# Patient Record
Sex: Female | Born: 1985 | Hispanic: Yes | Marital: Married | Smoking: Never smoker
Health system: Southern US, Community
[De-identification: ages and names within clinical notes are randomized; demographics above are authoritative.]

## PROBLEM LIST (undated history)

## (undated) DIAGNOSIS — F419 Anxiety disorder, unspecified: Secondary | ICD-10-CM

## (undated) DIAGNOSIS — Z3490 Encounter for supervision of normal pregnancy, unspecified, unspecified trimester: Secondary | ICD-10-CM

## (undated) DIAGNOSIS — U071 COVID-19: Secondary | ICD-10-CM

## (undated) DIAGNOSIS — T7840XA Allergy, unspecified, initial encounter: Secondary | ICD-10-CM

## (undated) DIAGNOSIS — K429 Umbilical hernia without obstruction or gangrene: Secondary | ICD-10-CM

## (undated) DIAGNOSIS — J45909 Unspecified asthma, uncomplicated: Secondary | ICD-10-CM

## (undated) HISTORY — DX: Unspecified asthma, uncomplicated: J45.909

## (undated) HISTORY — DX: Allergy, unspecified, initial encounter: T78.40XA

## (undated) HISTORY — DX: Encounter for supervision of normal pregnancy, unspecified, unspecified trimester: Z34.90

---

## 2012-04-17 DIAGNOSIS — Z3490 Encounter for supervision of normal pregnancy, unspecified, unspecified trimester: Secondary | ICD-10-CM

## 2012-04-17 HISTORY — DX: Encounter for supervision of normal pregnancy, unspecified, unspecified trimester: Z34.90

## 2012-08-19 DIAGNOSIS — D069 Carcinoma in situ of cervix, unspecified: Secondary | ICD-10-CM | POA: Insufficient documentation

## 2013-03-20 ENCOUNTER — Ambulatory Visit (INDEPENDENT_AMBULATORY_CARE_PROVIDER_SITE_OTHER): Payer: Self-pay | Admitting: General Surgery

## 2013-03-27 ENCOUNTER — Encounter (INDEPENDENT_AMBULATORY_CARE_PROVIDER_SITE_OTHER): Payer: Self-pay

## 2013-03-27 ENCOUNTER — Encounter (INDEPENDENT_AMBULATORY_CARE_PROVIDER_SITE_OTHER): Payer: Self-pay | Admitting: General Surgery

## 2013-03-27 ENCOUNTER — Ambulatory Visit (INDEPENDENT_AMBULATORY_CARE_PROVIDER_SITE_OTHER): Payer: Self-pay | Admitting: General Surgery

## 2013-03-27 VITALS — BP 102/68 | HR 68 | Temp 97.1°F | Resp 16 | Ht 61.0 in | Wt 128.8 lb

## 2013-03-27 DIAGNOSIS — K439 Ventral hernia without obstruction or gangrene: Secondary | ICD-10-CM

## 2013-03-27 NOTE — Progress Notes (Signed)
Patient ID: Rita Lynch, female   DOB: [birth year], 27 y.o.   MRN: [id number]  No chief complaint on file.   HPI Rita Lynch is a 27 y.o. female.  The patient is a 27 year old female who is 6 months pregnant who is here for the evaluation of a ventral hernia. Patient is referred by Dr. Gildardo Griffes. This states she's had this there for approximately 4-5 years. She states that the hernias bigger with her pregnancy. She is mainly concerned that the hernia would burst at the time of delivery the baby.   The patient said no signs or symptoms of incarceration or strangulation. HPI  Past Medical History  Diagnosis Date  . Pregnant 2014    No past surgical history on file.  No family history on file.  Social History History  Substance Use Topics  . Smoking status: Never Smoker   . Smokeless tobacco: Not on file  . Alcohol Use: No    No Known Allergies  Current Outpatient Prescriptions  Medication Sig Dispense Refill  . Prenatal Vit-Fe Fumarate-FA (PRENATAL MULTIVITAMIN) TABS tablet Take 1 tablet by mouth daily at 12 noon.       No current facility-administered medications for this visit.    Review of Systems Review of Systems  Constitutional: Negative.   HENT: Negative.   Eyes: Negative.   Respiratory: Negative.   Cardiovascular: Negative.   Gastrointestinal: Negative.   Neurological: Negative.   All other systems reviewed and are negative.    Blood pressure 102/68, pulse 68, temperature 97.1 F (36.2 C), temperature source Temporal, resp. rate 16, height 5\' 1"  (1.549 m), weight 128 lb 12.8 oz (58.423 kg).  Physical Exam Physical Exam  Constitutional: She is oriented to person, place, and time. She appears well-developed and well-nourished.  HENT:  Head: Normocephalic and atraumatic.  Eyes: Conjunctivae and EOM are normal. Pupils are equal, round, and reactive to light.  Neck: Normal range of motion. Neck supple.  Cardiovascular: Normal rate, regular  rhythm and normal heart sounds.   Pulmonary/Chest: Effort normal and breath sounds normal.  Abdominal: A hernia is present. Hernia confirmed positive in the ventral area.    Musculoskeletal: Normal range of motion.  Neurological: She is alert and oriented to person, place, and time.  Skin: Skin is warm and dry.  Psychiatric: She has a normal mood and affect.    Data Reviewed none  Assessment    27 year old pregnant female, 6 months pregnant, with a primary ventral hernia.     Plan    1. I discussed with her the likelihood that the hernia would like recurrent time of her pregnancy. Also think that repair the hernia at this time would result in a very high recurrence and the pregnancy. 2. I discussed with her that there would likely coursing of the hernia upon delivery. 3. The patient would like to have this repaired after delivery her pregnancy. She will followup at that time. 4. We discussed the signs and symptoms of incarceration and strangulation and the need to present to the ER should this occur.        Rita Lynch., Rita Lynch 03/27/2013, 8:57 AM

## 2013-07-01 ENCOUNTER — Inpatient Hospital Stay (HOSPITAL_COMMUNITY)
Admission: AD | Admit: 2013-07-01 | Discharge: 2013-07-02 | DRG: 775 | Disposition: A | Discharge status: Home / Self Care | Payer: Medicaid Other | Source: Ambulatory Visit | Attending: Family Medicine | Admitting: Family Medicine

## 2013-07-01 ENCOUNTER — Encounter (HOSPITAL_COMMUNITY): Payer: Self-pay | Admitting: *Deleted

## 2013-07-01 DIAGNOSIS — IMO0001 Reserved for inherently not codable concepts without codable children: Secondary | ICD-10-CM

## 2013-07-01 LAB — CBC
HCT: 31.2 % — ABNORMAL LOW (ref 36.0–46.0)
Hemoglobin: 10.5 g/dL — ABNORMAL LOW (ref 12.0–15.0)
MCH: 27.8 pg (ref 26.0–34.0)
MCHC: 33.7 g/dL (ref 30.0–36.0)
MCV: 82.5 fL (ref 78.0–100.0)
Platelets: 176 10*3/uL (ref 150–400)
RBC: 3.78 MIL/uL — ABNORMAL LOW (ref 3.87–5.11)
RDW: 13.1 % (ref 11.5–15.5)
WBC: 17.6 10*3/uL — ABNORMAL HIGH (ref 4.0–10.5)

## 2013-07-01 MED ORDER — PRENATAL MULTIVITAMIN CH
1.0000 | ORAL_TABLET | Freq: Every day | ORAL | Status: DC
  Administered 2013-07-02: 1 via ORAL
  Filled 2013-07-01: qty 1

## 2013-07-01 MED ORDER — TETANUS-DIPHTH-ACELL PERTUSSIS 5-2.5-18.5 LF-MCG/0.5 IM SUSP: 0.5000 mL | Freq: Once | INTRAMUSCULAR | Status: DC

## 2013-07-01 MED ORDER — BENZOCAINE-MENTHOL 20-0.5 % EX AERO: 1.0000 "application " | INHALATION_SPRAY | CUTANEOUS | Status: DC | PRN

## 2013-07-01 MED ORDER — DIPHENHYDRAMINE HCL 25 MG PO CAPS: 25.0000 mg | ORAL_CAPSULE | Freq: Four times a day (QID) | ORAL | Status: DC | PRN

## 2013-07-01 MED ORDER — LANOLIN HYDROUS EX OINT: TOPICAL_OINTMENT | CUTANEOUS | Status: DC | PRN

## 2013-07-01 MED ORDER — SENNOSIDES-DOCUSATE SODIUM 8.6-50 MG PO TABS
2.0000 | ORAL_TABLET | ORAL | Status: DC
  Administered 2013-07-02: 2 via ORAL
  Filled 2013-07-01 (×2): qty 2

## 2013-07-01 MED ORDER — ZOLPIDEM TARTRATE 5 MG PO TABS: 5.0000 mg | ORAL_TABLET | Freq: Every evening | ORAL | Status: DC | PRN

## 2013-07-01 MED ORDER — ONDANSETRON HCL 4 MG PO TABS: 4.0000 mg | ORAL_TABLET | ORAL | Status: DC | PRN

## 2013-07-01 MED ORDER — ONDANSETRON HCL 4 MG/2ML IJ SOLN: 4.0000 mg | INTRAMUSCULAR | Status: DC | PRN

## 2013-07-01 MED ORDER — OXYCODONE-ACETAMINOPHEN 5-325 MG PO TABS
1.0000 | ORAL_TABLET | ORAL | Status: DC | PRN
  Administered 2013-07-02: 1 via ORAL
  Filled 2013-07-01: qty 1

## 2013-07-01 MED ORDER — DIBUCAINE 1 % RE OINT: 1.0000 "application " | TOPICAL_OINTMENT | RECTAL | Status: DC | PRN

## 2013-07-01 MED ORDER — WITCH HAZEL-GLYCERIN EX PADS: 1.0000 "application " | MEDICATED_PAD | CUTANEOUS | Status: DC | PRN

## 2013-07-01 MED ORDER — IBUPROFEN 600 MG PO TABS
600.0000 mg | ORAL_TABLET | Freq: Four times a day (QID) | ORAL | Status: DC
  Administered 2013-07-01 – 2013-07-02 (×4): 600 mg via ORAL
  Filled 2013-07-01 (×4): qty 1

## 2013-07-01 MED ORDER — SIMETHICONE 80 MG PO CHEW: 80.0000 mg | CHEWABLE_TABLET | ORAL | Status: DC | PRN

## 2013-07-01 NOTE — H&P (Addendum)
Rita Lynch is a 28 y.o. female [phone] with IUP at 8547w6d presenting for active labor. Upon arrival in the MAU she was complete and pushing. She delivered a health female w/o complications. She denies any complaints post delivery, or any complications with this or her previous two pregnancies/deliveries.   Prenatal History/Complications: None  Past Medical History: Past Medical History  Diagnosis Date  . Pregnant 2014    Past Surgical History: History reviewed. No pertinent past surgical history.  Obstetrical History: OB History   Grav Para Term Preterm Abortions TAB SAB Ect Mult Living   3 3 3       3       Social History: History   Social History  . Marital Status: Single    Spouse Name: N/A    Number of Children: N/A  . Years of Education: N/A   Social History Main Topics  . Smoking status: Never Smoker   . Smokeless tobacco: None  . Alcohol Use: No  . Drug Use: None  . Sexual Activity: Not Currently   Other Topics Concern  . None   Social History Narrative  . None    Family History: History reviewed. No pertinent family history.  Allergies: No Known Allergies  Prescriptions prior to admission  Medication Sig Dispense Refill  . Prenatal Vit-Fe Fumarate-FA (PRENATAL MULTIVITAMIN) TABS tablet Take 1 tablet by mouth daily at 12 noon.        Review of Systems: Negative unless otherwise stated in History above  Physicial Blood pressure 97/46, pulse 84, temperature 97.9 F (36.6 C), temperature source Oral, resp. rate 18, SpO2 99.00%, unknown if currently breastfeeding. General appearance: alert, cooperative and no distress Lungs: clear to auscultation bilaterally Heart: regular rate and rhythm Abdomen: soft, non-tender; bowel sounds normal; Uterus soft but not boggy Extremities: Homans sign is negative, no sign of DVT   Prenatal labs: ABO, Rh:   Antibody:   Rubella:   RPR:    HBsAg:    HIV:    GBS:    GTT: wnl  Prenatal Transfer Tool   Maternal Diabetes: No Genetic Screening: Normal Maternal Ultrasounds/Referrals: Declined Fetal Ultrasounds or other Referrals:  None Maternal Substance Abuse:  No Significant Maternal Medications:  None Significant Maternal Lab Results: Lab values include: Group B Strep negative  No results found for this or any previous visit (from the past 24 hour(s)).  Assessment: Rita Lynch is a 28 y.o. G3P3003 who delivered precipitously in MAU at 6347w6d. Doing well w/o complaints   Delivery of health baby girl - Admit to postpartum - Pain control: Ibuprofen, Ice packs  #ID:  GBS negative #Feeding: Breast/Bottle #MOC:Desires Mirena at 6wk postpartum visit (Health dept) #Circ:  NA - female  Wenda LowJames Joyner MD [facility] PGY-1 07/01/2013, 7:26 PM  I examined pt and agree with documentation above and resident plan of care. [facility], IncMUHAMMAD,WALIDAH

## 2013-07-02 ENCOUNTER — Encounter (HOSPITAL_COMMUNITY): Payer: Self-pay | Admitting: *Deleted

## 2013-07-02 MED ORDER — IBUPROFEN 600 MG PO TABS: 600.0000 mg | ORAL_TABLET | Freq: Four times a day (QID) | ORAL | Status: DC

## 2013-07-02 NOTE — Lactation Note (Addendum)
This note was copied from the chart of [name]. Lactation Consultation Note Initial consult:  Baby girl 2719 hours old. Mother states she has no milk on left breast in side lying position. Reviewed hand expression and mother has good drops of colostrum. Mother states baby recently breastfed for 15 min.  Denies soreness.  Reviewed basics, supply and demand, lactation support services and brochure. Encouraged mother to call for further assitance.  Patient Name: [name] ZOXWR'UToday's Date: 07/02/2013 Reason for consult: Initial assessment   Maternal Data Has patient been taught Hand Expression?: Yes Does the patient have breastfeeding experience prior to this delivery?: Yes  Feeding Feeding Type: Breast Fed Length of feed: 15 min  LATCH Score/Interventions                      Lactation Tools Discussed/Used     Consult Status Consult Status: PRN    Hardie PulleyBerkelhammer, Karelyn Brisby Boschen 07/02/2013, 2:33 PM

## 2013-07-02 NOTE — Discharge Instructions (Signed)

## 2013-07-02 NOTE — Progress Notes (Signed)
Ur chart review completed.  

## 2013-07-02 NOTE — Discharge Summary (Signed)
Obstetric Discharge Summary Reason for Admission: onset of labor Prenatal Procedures: Unknown Intrapartum Procedures: spontaneous vaginal delivery Postpartum Procedures: none Complications-Operative and Postpartum: none Hemoglobin  Date Value Ref Range Status  07/01/2013 10.5* 12.0 - 15.0 g/dL Final     HCT  Date Value Ref Range Status  07/01/2013 31.2* 36.0 - 46.0 % Final    Physical Exam:  General: alert, cooperative and no distress Lochia: appropriate Uterine Fundus: firm Incision: N/A DVT Evaluation: No evidence of DVT seen on physical exam. Negative Homan's sign. No cords or calf tenderness.  Discharge Diagnoses: Term Pregnancy-delivered  Discharge Information: Date: 07/02/2013 Activity: pelvic rest Diet: routine Medications: Ibuprofen Condition: stable Instructions: refer to practice specific booklet Discharge to: home Follow-up Information   [facility]   Contact information:   [address] [place] [address] [phone]     Brief Hospital Course: [name] delivered viable female precipitously in the MAU, no complications noted. Feeding with breast and bottle, desires mirena, and will follow up with health department in 6 weeks.   Newborn Data: Live born female  Birth Weight: 8 lb 3 oz (3714 g) APGAR: 8, 9  Home with mother.  Rita Lynch, Rita Lynch 07/02/2013, 8:08 AM  I spoke with and examined patient and agree with resident's note and plan of care.  Tawana ScaleMichael Ryan Jayveon Convey, MD OB Fellow 07/02/2013 12:55 PM

## 2013-07-02 NOTE — Progress Notes (Signed)
Note as dictated by Shela CommonsJ. Spurlock-Frizzell, RN: At [phone], assisted patient to room 8 via W/C, 3rd baby, 2 prior uncomplicated pregnancies. Patient was breathing with contractions. No bleeding, had started leaking at 1800, clear fluid noted in W/C when patient got up. Assisted patient into gown. Patient was calmly standing, bent over bed and quietly stated, "it's coming." Patient's husband helped her into bed. Removed pad and noted crowning. Called for a delivery table and a doctor. Blanche EastJ. Rasch, NP came into room and assisted with delivery of viable baby girl at [id number]. Baby was dried off, fluid wiped from face and mouth. Baby was placed on mother's chest and covered with a warm blanket. At 1801 Roney MarionW. Muhammad, CNM arrived.

## 2013-07-03 NOTE — H&P (Signed)
I examined pt and agree with documentation above and resident plan of care. MUHAMMAD,Lillianah Swartzentruber  

## 2014-02-16 ENCOUNTER — Encounter (HOSPITAL_COMMUNITY): Payer: Self-pay | Admitting: *Deleted

## 2015-09-18 ENCOUNTER — Encounter (HOSPITAL_BASED_OUTPATIENT_CLINIC_OR_DEPARTMENT_OTHER): Payer: Self-pay | Admitting: *Deleted

## 2015-09-18 ENCOUNTER — Emergency Department (HOSPITAL_BASED_OUTPATIENT_CLINIC_OR_DEPARTMENT_OTHER)
Admission: EM | Admit: 2015-09-18 | Discharge: 2015-09-18 | Disposition: A | Discharge status: Home / Self Care | Payer: Medicaid Other | Attending: Emergency Medicine | Admitting: Emergency Medicine

## 2015-09-18 DIAGNOSIS — J020 Streptococcal pharyngitis: Secondary | ICD-10-CM | POA: Insufficient documentation

## 2015-09-18 DIAGNOSIS — Z79899 Other long term (current) drug therapy: Secondary | ICD-10-CM | POA: Insufficient documentation

## 2015-09-18 HISTORY — DX: Umbilical hernia without obstruction or gangrene: K42.9

## 2015-09-18 LAB — RAPID STREP SCREEN (MED CTR MEBANE ONLY): STREPTOCOCCUS, GROUP A SCREEN (DIRECT): POSITIVE — AB

## 2015-09-18 MED ORDER — IBUPROFEN 800 MG PO TABS
800.0000 mg | ORAL_TABLET | Freq: Once | ORAL | Status: AC
  Administered 2015-09-18: 800 mg via ORAL
  Filled 2015-09-18: qty 1

## 2015-09-18 MED ORDER — IBUPROFEN 800 MG PO TABS: 800.0000 mg | ORAL_TABLET | Freq: Three times a day (TID) | ORAL | Status: DC

## 2015-09-18 MED ORDER — PENICILLIN G BENZATHINE 1200000 UNIT/2ML IM SUSP
1.2000 10*6.[IU] | Freq: Once | INTRAMUSCULAR | Status: AC
  Administered 2015-09-18: 1.2 10*6.[IU] via INTRAMUSCULAR
  Filled 2015-09-18: qty 2

## 2015-09-18 NOTE — Discharge Instructions (Signed)

## 2015-09-18 NOTE — ED Provider Notes (Signed)
CSN: [id number]     Arrival date & time 09/18/15  [id number] History   First MD Initiated Contact with Patient 09/18/15 [phone]     Chief Complaint  Patient presents with  . Sore Throat     (Consider location/radiation/quality/duration/timing/severity/associated sxs/prior Treatment) HPI Patient is a sore throat for approximately 2 days. She does report fever. She measured temperature yesterday and it was over 100. Pain with swallowing. No difficulty breathing. Mild diffuse headache. No cough, no nasal congestion, no earache. Mild diffuse body ache. She reports her daughter has a sore throat and an ear infection as well. She does not have known strep throat exposure.  The patient has IUD for birth control. Past Medical History  Diagnosis Date  . Pregnant 2014  . Umbilical hernia    History reviewed. No pertinent past surgical history. No family history on file. Social History  Substance Use Topics  . Smoking status: Never Smoker   . Smokeless tobacco: None  . Alcohol Use: No   OB History    Gravida Para Term Preterm AB TAB SAB Ectopic Multiple Living   4 4 4       4      Review of Systems  10 Systems reviewed and are negative for acute change except as noted in the HPI.   Allergies  Review of patient's allergies indicates no known allergies.  Home Medications   Prior to Admission medications   Medication Sig Start Date End Date Taking? Authorizing Provider  acetaminophen (TYLENOL) 325 MG tablet Take 650 mg by mouth every 6 (six) hours as needed.   Yes Historical Provider, MD  ibuprofen (ADVIL,MOTRIN) 600 MG tablet Take 1 tablet (600 mg total) by mouth every 6 (six) hours. 07/02/13   Sandrea MatteJonathan Hall, MD  ibuprofen (ADVIL,MOTRIN) 800 MG tablet Take 1 tablet (800 mg total) by mouth 3 (three) times daily. 09/18/15   Arby BarretteMarcy Jamey Harman, MD  Prenatal Vit-Fe Fumarate-FA (PRENATAL MULTIVITAMIN) TABS tablet Take 1 tablet by mouth daily at 12 noon.    Historical Provider, MD   BP 112/66 mmHg   Pulse 84  Temp(Src) 98.6 F (37 C) (Oral)  Resp 20  Ht 5\' 1"  (1.549 m)  Wt 145 lb (65.772 kg)  BMI 27.41 kg/m2  SpO2 100% Physical Exam  Constitutional: She is oriented to person, place, and time. She appears well-developed and well-nourished.  HENT:  Head: Normocephalic and atraumatic.  Posterior oropharynx, erythema bilateral tonsils. Mild exudate bilaterally. Posterior pharynx widely patent. Neck supple without meningismus. No trismus. No significant lymphadenopathy.  Eyes: EOM are normal. Pupils are equal, round, and reactive to light.  Neck: Neck supple.  Cardiovascular: Normal rate, regular rhythm, normal heart sounds and intact distal pulses.   Pulmonary/Chest: Effort normal and breath sounds normal.  Abdominal: Soft. Bowel sounds are normal. She exhibits no distension. There is no tenderness.  Musculoskeletal: Normal range of motion. She exhibits no edema.  Neurological: She is alert and oriented to person, place, and time. She has normal strength. Coordination normal. GCS eye subscore is 4. GCS verbal subscore is 5. GCS motor subscore is 6.  Skin: Skin is warm, dry and intact.  Psychiatric: She has a normal mood and affect.    ED Course  Procedures (including critical care time) Labs Review Labs Reviewed  RAPID STREP SCREEN (NOT AT [facility]) - Abnormal; Notable for the following:    Streptococcus, Group A Screen (Direct) POSITIVE (*)    All other components within normal limits    Imaging Review No  results found. I have personally reviewed and evaluated these images and lab results as part of my medical decision-making.   EKG Interpretation None      MDM   Final diagnoses:  Strep pharyngitis   Patient is nontoxic and alert. There is no airway compromise. Patient will be treated with Bicillin IM in the emergency department. She is given prescription for ibuprofen for pain and fever.    Arby Barrette, MD 09/18/15 [phone]

## 2015-09-18 NOTE — ED Notes (Addendum)
Patient states she developed a fever yesterday, and a sore throat last night.  Has been taking tylenol and advil for her fever.  Last dose 0625 am today. C/o generalized body aches and headache as well.

## 2018-04-05 ENCOUNTER — Emergency Department (HOSPITAL_BASED_OUTPATIENT_CLINIC_OR_DEPARTMENT_OTHER): Payer: Self-pay

## 2018-04-05 ENCOUNTER — Other Ambulatory Visit: Payer: Self-pay

## 2018-04-05 ENCOUNTER — Emergency Department (HOSPITAL_BASED_OUTPATIENT_CLINIC_OR_DEPARTMENT_OTHER)
Admission: EM | Admit: 2018-04-05 | Discharge: 2018-04-05 | Disposition: A | Discharge status: Home / Self Care | Payer: Self-pay | Attending: Emergency Medicine | Admitting: Emergency Medicine

## 2018-04-05 ENCOUNTER — Encounter (HOSPITAL_BASED_OUTPATIENT_CLINIC_OR_DEPARTMENT_OTHER): Payer: Self-pay

## 2018-04-05 DIAGNOSIS — R0602 Shortness of breath: Secondary | ICD-10-CM | POA: Insufficient documentation

## 2018-04-05 DIAGNOSIS — Z79899 Other long term (current) drug therapy: Secondary | ICD-10-CM | POA: Insufficient documentation

## 2018-04-05 LAB — CBC WITH DIFFERENTIAL/PLATELET
Abs Immature Granulocytes: 0.02 10*3/uL (ref 0.00–0.07)
Basophils Absolute: 0 10*3/uL (ref 0.0–0.1)
Basophils Relative: 0 %
Eosinophils Absolute: 0.1 10*3/uL (ref 0.0–0.5)
Eosinophils Relative: 1 %
HCT: 43.1 % (ref 36.0–46.0)
Hemoglobin: 13.7 g/dL (ref 12.0–15.0)
Immature Granulocytes: 0 %
LYMPHS PCT: 17 %
Lymphs Abs: 1.5 10*3/uL (ref 0.7–4.0)
MCH: 27.7 pg (ref 26.0–34.0)
MCHC: 31.8 g/dL (ref 30.0–36.0)
MCV: 87.2 fL (ref 80.0–100.0)
Monocytes Absolute: 0.7 10*3/uL (ref 0.1–1.0)
Monocytes Relative: 7 %
Neutro Abs: 6.9 10*3/uL (ref 1.7–7.7)
Neutrophils Relative %: 75 %
Platelets: 217 10*3/uL (ref 150–400)
RBC: 4.94 MIL/uL (ref 3.87–5.11)
RDW: 13.2 % (ref 11.5–15.5)
WBC: 9.3 10*3/uL (ref 4.0–10.5)
nRBC: 0 % (ref 0.0–0.2)

## 2018-04-05 LAB — URINALYSIS, ROUTINE W REFLEX MICROSCOPIC
BILIRUBIN URINE: NEGATIVE
Glucose, UA: NEGATIVE mg/dL
Hgb urine dipstick: NEGATIVE
Ketones, ur: NEGATIVE mg/dL
Leukocytes, UA: NEGATIVE
NITRITE: NEGATIVE
Protein, ur: NEGATIVE mg/dL
Specific Gravity, Urine: <1.005 — ABNORMAL LOW (ref 1.005–1.030)
pH: 6 (ref 5.0–8.0)

## 2018-04-05 LAB — BASIC METABOLIC PANEL
Anion gap: 7 (ref 5–15)
BUN: 11 mg/dL (ref 6–20)
CO2: 22 mmol/L (ref 22–32)
Calcium: 9.1 mg/dL (ref 8.9–10.3)
Chloride: 110 mmol/L (ref 98–111)
Creatinine, Ser: 0.68 mg/dL (ref 0.44–1.00)
GFR calc Af Amer: >60 mL/min (ref >60)
GFR calc non Af Amer: >60 mL/min (ref >60)
Glucose, Bld: 102 mg/dL — ABNORMAL HIGH (ref 70–99)
Potassium: 3.6 mmol/L (ref 3.5–5.1)
Sodium: 139 mmol/L (ref 135–145)

## 2018-04-05 LAB — D-DIMER, QUANTITATIVE (NOT AT ARMC): D DIMER QUANT: 0.3 ug{FEU}/mL (ref 0.00–0.50)

## 2018-04-05 LAB — PREGNANCY, URINE: Preg Test, Ur: NEGATIVE

## 2018-04-05 NOTE — ED Provider Notes (Signed)
[facility] EMERGENCY DEPARTMENT Provider Note   CSN: [id number] Arrival date & time: 04/05/18  1608     History   Chief Complaint Chief Complaint  Patient presents with  . Shortness of Breath    HPI Rita Lynch is a 32 y.o. female.  Patient is a 32 year old female who presents with shortness of breath.  She states that she has had some intermittent shortness of breath over the last month but it got worse this morning which is why she came in.  She denies any wheezing.  No cough or chest congestion.  No chest pain or tightness.  No leg pain or swelling.  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  She has no history of underlying lung problems.  No smoking history.  She also reports some neck pain which is been going on for several months.  It is intermittent and sometimes is on the right side sometimes is on the left side.  She has no radiation down her arms.  She has some intermittent tingling in her fingers which also changes from side to side but denies any current numbness.  No weakness in her arms.  It does not seem to be associated with her shortness of breath.     Past Medical History:  Diagnosis Date  . Pregnant 2014  . Umbilical hernia     Patient Active Problem List   Diagnosis Date Noted  . Active labor at term 07/01/2013    History reviewed. No pertinent surgical history.   OB History    Gravida  4   Para  4   Term  4   Preterm      AB      Living  4     SAB      TAB      Ectopic      Multiple      Live Births  3            Home Medications    Prior to Admission medications   Medication Sig Start Date End Date Taking? Authorizing Provider  acetaminophen (TYLENOL) 325 MG tablet Take 650 mg by mouth every 6 (six) hours as needed.    [provider]  ibuprofen (ADVIL,MOTRIN) 600 MG tablet Take 1 tablet (600 mg total) by mouth every 6 (six) hours. 07/02/13   Sandrea MatteHall,  Jonathan, MD  ibuprofen (ADVIL,MOTRIN) 800 MG tablet Take 1 tablet (800 mg total) by mouth 3 (three) times daily. 09/18/15   Arby BarrettePfeiffer, Marcy, MD  Prenatal Vit-Fe Fumarate-FA (PRENATAL MULTIVITAMIN) TABS tablet Take 1 tablet by mouth daily at 12 noon.    [provider]    Family History No family history on file.  Social History Social History   Tobacco Use  . Smoking status: Never Smoker  . Smokeless tobacco: Never Used  Substance Use Topics  . Alcohol use: No    Frequency: Never  . Drug use: Never     Allergies   Patient has no known allergies.   Review of Systems Review of Systems  Constitutional: Negative for chills, diaphoresis, fatigue and fever.  HENT: Negative for congestion, rhinorrhea and sneezing.   Eyes: Negative.   Respiratory: Positive for shortness of breath. Negative for cough and chest tightness.   Cardiovascular: Negative for chest pain and leg swelling.  Gastrointestinal: Negative for abdominal pain, blood in stool, diarrhea, nausea and vomiting.  Genitourinary: Negative for difficulty urinating, flank pain,  frequency and hematuria.  Musculoskeletal: Positive for neck pain. Negative for arthralgias and back pain.  Skin: Negative for rash.  Neurological: Negative for dizziness, speech difficulty, weakness, numbness and headaches.     Physical Exam Updated Vital Signs BP 98/70 (BP Location: Left Arm)   Pulse 84   Temp 98.3 F (36.8 C) (Oral)   Resp 18   Ht 5\' 1"  (1.549 m)   Wt 64.4 kg   SpO2 100%   BMI 26.83 kg/m   Physical Exam Constitutional:      Appearance: She is well-developed.  HENT:     Head: Normocephalic and atraumatic.  Eyes:     Pupils: Pupils are equal, round, and reactive to light.  Neck:     Musculoskeletal: Normal range of motion and neck supple.     Comments: No palpable tenderness to the spine, there is some tenderness to the musculature in the left paraspinal muscles Cardiovascular:     Rate and Rhythm: Normal  rate and regular rhythm.     Heart sounds: Normal heart sounds.  Pulmonary:     Effort: Pulmonary effort is normal. No respiratory distress.     Breath sounds: Normal breath sounds. No wheezing or rales.  Chest:     Chest wall: No tenderness.  Abdominal:     General: Bowel sounds are normal.     Palpations: Abdomen is soft.     Tenderness: There is no abdominal tenderness. There is no guarding or rebound.  Musculoskeletal: Normal range of motion.  Lymphadenopathy:     Cervical: No cervical adenopathy.  Skin:    General: Skin is warm and dry.     Findings: No rash.  Neurological:     General: No focal deficit present.     Mental Status: She is alert and oriented to person, place, and time.     Sensory: Sensation is intact.     Motor: No weakness.      ED Treatments / Results  Labs (all labs ordered are listed, but only abnormal results are displayed) Labs Reviewed  URINALYSIS, ROUTINE W REFLEX MICROSCOPIC - Abnormal; Notable for the following components:      Result Value   Specific Gravity, Urine <1.005 (*)    All other components within normal limits  PREGNANCY, URINE  BASIC METABOLIC PANEL  CBC WITH DIFFERENTIAL/PLATELET  D-DIMER, QUANTITATIVE (NOT AT [facility])    EKG EKG Interpretation  Date/Time:  Friday April 05 2018 16:16:47 EST Ventricular Rate:  81 PR Interval:  148 QRS Duration: 90 QT Interval:  362 QTC Calculation: 420 R Axis:   93 Text Interpretation:  Normal sinus rhythm Rightward axis T wave abnormality, consider anterior ischemia Abnormal ECG No old tracing to compare Confirmed by Rolan BuccoBelfi, Dedra Matsuo [phone]) on 04/05/2018 4:48:28 PM   Radiology Dg Chest 2 View  Result Date: 04/05/2018 CLINICAL DATA:  Shortness of breath and dizziness for 2 days. EXAM: CHEST - 2 VIEW COMPARISON:  None. FINDINGS: The lungs are clear. Heart size is normal. There is no pneumothorax or pleural fluid. No bony abnormality. IMPRESSION: Normal chest. Electronically Signed   By:  Drusilla Kannerhomas  Dalessio M.D.   On: 04/05/2018 16:33    Procedures Procedures (including critical care time)  Medications Ordered in ED Medications - No data to display   Initial Impression / Assessment and Plan / ED Course  I have reviewed the triage vital signs and the nursing notes.  Pertinent labs & imaging results that were available during my care of the patient  were reviewed by me and considered in my medical decision making (see chart for details).     Patient is a 32 year old female who presents with shortness of breath.  She has no other associated symptoms.  She has no current shortness of breath.  Her chest x-ray is clear without evidence of pneumonia.  She has no hypoxia.  No tachycardia.  Her d-dimer is normal without other suggestions of pulmonary embolus.  She has no arrhythmias or signs of ACS.  I talked to her about possible panic attacks and she does say she is under a lot of stress but does not have a history of similar symptoms in the past.  She has an appointment with a PCP at the health department on Monday which is in 3 days.  Return precautions were given.  As far as her neck pain.  It seems to be musculoskeletal.  She has occasionally some tingling in her fingers but denies any neurologic deficits currently.  She will also discuss this with her PCP on Monday.  Final Clinical Impressions(s) / ED Diagnoses   Final diagnoses:  None    ED Discharge Orders    None       Rolan Bucco, MD 04/05/18 2012

## 2018-04-05 NOTE — ED Notes (Signed)
Pt. Reports she has felt short of breath for approx. 2 mths with no injury.  No pain in her chest.  Pt. Reports today the feeling of shortness of breath got worse.  Pt. Sat is 100% HR is 77   Pt. Alert and oriented.    Pt. Reports she has trouble lying down to sleep last night for the 1st time.

## 2018-04-05 NOTE — ED Notes (Signed)
Pulse ox while ambulating 98%-100%. Pt NAD

## 2018-04-05 NOTE — ED Triage Notes (Signed)
C/o SOB, dizziness, posterior neck pain started yesterday-pt denies injury to neck-NAD-steady gait-tearful

## 2018-04-05 NOTE — ED Notes (Signed)
Chronic neck pain in the back of her neck for a week.  Pt. Reports using icy hot and other otc meds with no relief.

## 2018-05-30 ENCOUNTER — Emergency Department (HOSPITAL_COMMUNITY): Payer: Self-pay

## 2018-05-30 ENCOUNTER — Emergency Department (HOSPITAL_COMMUNITY)
Admission: EM | Admit: 2018-05-30 | Discharge: 2018-05-30 | Disposition: A | Discharge status: Home / Self Care | Payer: Self-pay | Attending: Emergency Medicine | Admitting: Emergency Medicine

## 2018-05-30 ENCOUNTER — Other Ambulatory Visit: Payer: Self-pay

## 2018-05-30 ENCOUNTER — Encounter (HOSPITAL_COMMUNITY): Payer: Self-pay

## 2018-05-30 DIAGNOSIS — R0602 Shortness of breath: Secondary | ICD-10-CM | POA: Insufficient documentation

## 2018-05-30 DIAGNOSIS — J029 Acute pharyngitis, unspecified: Secondary | ICD-10-CM | POA: Insufficient documentation

## 2018-05-30 DIAGNOSIS — Z79899 Other long term (current) drug therapy: Secondary | ICD-10-CM | POA: Insufficient documentation

## 2018-05-30 LAB — BASIC METABOLIC PANEL
Anion gap: 6 (ref 5–15)
BUN: 8 mg/dL (ref 6–20)
CO2: 26 mmol/L (ref 22–32)
Calcium: 9.4 mg/dL (ref 8.9–10.3)
Chloride: 107 mmol/L (ref 98–111)
Creatinine, Ser: 0.81 mg/dL (ref 0.44–1.00)
GFR calc Af Amer: >60 mL/min (ref >60)
GFR calc non Af Amer: >60 mL/min (ref >60)
Glucose, Bld: 83 mg/dL (ref 70–99)
Potassium: 4 mmol/L (ref 3.5–5.1)
Sodium: 139 mmol/L (ref 135–145)

## 2018-05-30 LAB — CBC
HCT: 38.4 % (ref 36.0–46.0)
Hemoglobin: 12.5 g/dL (ref 12.0–15.0)
MCH: 28.3 pg (ref 26.0–34.0)
MCHC: 32.6 g/dL (ref 30.0–36.0)
MCV: 87.1 fL (ref 80.0–100.0)
PLATELETS: 186 10*3/uL (ref 150–400)
RBC: 4.41 MIL/uL (ref 3.87–5.11)
RDW: 12.8 % (ref 11.5–15.5)
WBC: 5.5 10*3/uL (ref 4.0–10.5)
nRBC: 0 % (ref 0.0–0.2)

## 2018-05-30 LAB — I-STAT BETA HCG BLOOD, ED (MC, WL, AP ONLY): I-stat hCG, quantitative: <5 m[IU]/mL (ref <5)

## 2018-05-30 MED ORDER — ALUM & MAG HYDROXIDE-SIMETH 200-200-20 MG/5ML PO SUSP
30.0000 mL | Freq: Once | ORAL | Status: AC
  Administered 2018-05-30: 30 mL via ORAL
  Filled 2018-05-30: qty 30

## 2018-05-30 MED ORDER — LIDOCAINE VISCOUS HCL 2 % MT SOLN
15.0000 mL | Freq: Once | OROMUCOSAL | Status: AC
  Administered 2018-05-30: 15 mL via ORAL
  Filled 2018-05-30: qty 15

## 2018-05-30 NOTE — ED Provider Notes (Signed)
[facility]   CSN: [id number] Arrival date & time: 05/30/18  1611   History   Chief Complaint Chief Complaint  Patient presents with  . Shortness of Breath  . Sore Throat    HPI Rita Lynch is a 33 y.o. female with no significant PMH presenting with SOB that occurs intermittently, usually at night when she is laying in bed. She states the symptoms come on suddenly and she has associated chest pain. This has been ongoing for the past several months. She states for the past week she has had burning with swallowing and difficulty swallowing hard foods. She denies symptoms of heart burn. She denies recent cough or illness. She denies dizziness but states she sometimes has numbness in her arms without focal weakness. She denies LE swelling, changes in BM or urination.   HPI  Past Medical History:  Diagnosis Date  . Pregnant 2014  . Umbilical hernia     Patient Active Problem List   Diagnosis Date Noted  . Active labor at term 07/01/2013    History reviewed. No pertinent surgical history.   OB History    Gravida  4   Para  4   Term  4   Preterm      AB      Living  4     SAB      TAB      Ectopic      Multiple      Live Births  3            Home Medications    Prior to Admission medications   Medication Sig Start Date End Date Taking? Authorizing Provider  acetaminophen (TYLENOL) 325 MG tablet Take 650 mg by mouth every 6 (six) hours as needed for mild pain.    Yes [provider]  ibuprofen (ADVIL,MOTRIN) 800 MG tablet Take 1 tablet (800 mg total) by mouth 3 (three) times daily. Patient taking differently: Take 400 mg by mouth 3 (three) times daily as needed for headache.  09/18/15  Yes Arby BarrettePfeiffer, Marcy, MD  levonorgestrel (MIRENA) 20 MCG/24HR IUD 1 each by Intrauterine route once.   Yes [provider]  ibuprofen (ADVIL,MOTRIN) 600 MG tablet Take 1 tablet (600 mg total) by  mouth every 6 (six) hours. Patient not taking: Reported on 05/30/2018 07/02/13   Sandrea MatteHall, Jonathan, MD    Family History History reviewed. No pertinent family history.  Social History Social History   Tobacco Use  . Smoking status: Never Smoker  . Smokeless tobacco: Never Used  Substance Use Topics  . Alcohol use: No    Frequency: Never  . Drug use: Never     Allergies   Patient has no known allergies.   Review of Systems Review of Systems  ROS negative except as noted in HPI.   Physical Exam Updated Vital Signs BP 139/62   Pulse 77   Temp 98.4 F (36.9 C) (Oral)   Resp 16   Ht 5\' 1"  (1.549 m)   Wt 64.4 kg   SpO2 98%   BMI 26.83 kg/m   Physical Exam Constitution: NAD, well-nourished, well-dressed  HENT: AT/[place], no palpable thyroid nodules, moist mucous membranes, no erythema of throat Eyes: eom intact, no icterus or injection  Cardio: RRR, no m/r/g  Respiratory: CTA, no wheezing, rales, or rhonchi  Abdominal: NTTP, soft, non-distended, +BS MSK: moving all extremities, no edema Neuro: a&o, following commands  Skin: c/d/i   ED Treatments /  Results  Labs (all labs ordered are listed, but only abnormal results are displayed) Labs Reviewed  BASIC METABOLIC PANEL  CBC  URINALYSIS, ROUTINE W REFLEX MICROSCOPIC  I-STAT BETA HCG BLOOD, ED ([facility], [facility], [facility] ONLY)  CBG MONITORING, ED    EKG EKG Interpretation  Date/Time:  Thursday May 30 2018 16:30:36 EST Ventricular Rate:  70 PR Interval:  150 QRS Duration: 92 QT Interval:  392 QTC Calculation: 423 R Axis:   86 Text Interpretation:  Normal sinus rhythm Incomplete right bundle branch block Borderline ECG No significant change since last tracing Confirmed by Melene Plan [phone]) on 05/30/2018 9:14:23 PM   Radiology Dg Chest 2 View  Result Date: 05/30/2018 CLINICAL DATA:  Shortness of breath. EXAM: CHEST - 2 VIEW COMPARISON:  Radiograph of April 05, 2018. FINDINGS: The heart size and mediastinal contours are  within normal limits. Both lungs are clear. No pneumothorax or pleural effusion is noted. The visualized skeletal structures are unremarkable. IMPRESSION: No active cardiopulmonary disease. Electronically Signed   By: Lupita Raider, M.D.   On: 05/30/2018 18:13    Procedures Procedures (including critical care time)  Medications Ordered in ED Medications  alum & mag hydroxide-simeth (MAALOX/MYLANTA) 200-200-20 MG/5ML suspension 30 mL (30 mLs Oral Given 05/30/18 2133)    And  lidocaine (XYLOCAINE) 2 % viscous mouth solution 15 mL (15 mLs Oral Given 05/30/18 2133)     Initial Impression / Assessment and Plan / ED Course  I have reviewed the triage vital signs and the nursing notes.  Pertinent labs & imaging results that were available during my care of the patient were reviewed by me and considered in my medical decision making (see chart for details).    33 yo female presenting with sudden onset SOB and chest pain in the evenings when lying down. Recently has additionally had pain and burning with swallowing. Vitals and labs are stable and chest x-ray shows no acute process. She has no signs of infection or alarm symptoms and vitals are stable. Differential includes GERD, panic attacks, and non-palpable thyroid nodule and other esophageal dysmotility that will benefit from work-up in the outpatient setting. She will establish care with a PCP in ten days and has an appointment. Provided GI cocktail and recommendations for sore throat and symptoms of GERD. Return precautions given.   Final Clinical Impressions(s) / ED Diagnoses   Final diagnoses:  Sore throat  Shortness of breath    ED Discharge Orders    None       Seawell, Jaimie A, DO 05/30/18 2209    Melene Plan, DO 05/30/18 2234

## 2018-05-30 NOTE — Discharge Instructions (Addendum)
Try zantac or pepcid twice a day.  Try to avoid things that may make this worse, most commonly these are spicy foods tomato based products fatty foods chocolate and peppermint.  Alcohol and tobacco can also make this worse.  Return to the emergency department for sudden worsening pain fever or inability to eat or drink. ° °

## 2018-05-30 NOTE — ED Triage Notes (Signed)
Pt arrives POV for ongoing SOB x 1 mos, worse when laying down. Pt reports near syncopal episode while laying in bed this afternoon. Pt also endorses "burning, pain" in throat, no respiratory distress, oropharynx patent, managing own secretions in triage. Pt satting well on RA. Endorses int. CP. Pt reports she was seen for same 1 month ago.

## 2018-05-30 NOTE — ED Notes (Signed)
Patient verbalizes understanding of discharge instructions. Opportunity for questioning and answers were provided. Armband removed by staff, pt discharged from ED ambulatory.   

## 2018-06-21 ENCOUNTER — Other Ambulatory Visit: Payer: Self-pay

## 2018-06-21 ENCOUNTER — Emergency Department (HOSPITAL_BASED_OUTPATIENT_CLINIC_OR_DEPARTMENT_OTHER): Payer: Self-pay

## 2018-06-21 ENCOUNTER — Encounter (HOSPITAL_BASED_OUTPATIENT_CLINIC_OR_DEPARTMENT_OTHER): Payer: Self-pay | Admitting: Emergency Medicine

## 2018-06-21 ENCOUNTER — Emergency Department (HOSPITAL_BASED_OUTPATIENT_CLINIC_OR_DEPARTMENT_OTHER)
Admission: EM | Admit: 2018-06-21 | Discharge: 2018-06-21 | Disposition: A | Discharge status: Home / Self Care | Payer: Self-pay | Attending: Emergency Medicine | Admitting: Emergency Medicine

## 2018-06-21 DIAGNOSIS — R69 Illness, unspecified: Secondary | ICD-10-CM

## 2018-06-21 DIAGNOSIS — Z79899 Other long term (current) drug therapy: Secondary | ICD-10-CM | POA: Insufficient documentation

## 2018-06-21 DIAGNOSIS — J111 Influenza due to unidentified influenza virus with other respiratory manifestations: Secondary | ICD-10-CM | POA: Insufficient documentation

## 2018-06-21 DIAGNOSIS — R05 Cough: Secondary | ICD-10-CM | POA: Insufficient documentation

## 2018-06-21 MED ORDER — IBUPROFEN 400 MG PO TABS
600.0000 mg | ORAL_TABLET | Freq: Once | ORAL | Status: AC
  Administered 2018-06-21: 600 mg via ORAL
  Filled 2018-06-21: qty 1

## 2018-06-21 MED ORDER — ACETAMINOPHEN 325 MG PO TABS
650.0000 mg | ORAL_TABLET | Freq: Once | ORAL | Status: AC
  Administered 2018-06-21: 650 mg via ORAL
  Filled 2018-06-21: qty 2

## 2018-06-21 NOTE — ED Provider Notes (Signed)
[facility] EMERGENCY DEPARTMENT Provider Note   CSN: [id number] Arrival date & time: 06/21/18  4098    History   Chief Complaint Chief Complaint  Patient presents with  . flu like symptoms    HPI Rita Lynch is a 33 y.o. female.     HPI 33 year old female presents emergency department with sore throat runny nose cough and congestion over the past 4 to 5 days.  She reports low-grade fever at home.  Daughter diagnosed with influenza at the clinic last week.  Patient with myalgias.  Reports productive cough and some exertional shortness of breath.  No shortness of breath at rest.  No orthopnea.  No unilateral leg swelling.  No history of congestive heart failure. Past Medical History:  Diagnosis Date  . Pregnant 2014  . Umbilical hernia     Patient Active Problem List   Diagnosis Date Noted  . Active labor at term 07/01/2013    History reviewed. No pertinent surgical history.   OB History    Gravida  4   Para  4   Term  4   Preterm      AB      Living  4     SAB      TAB      Ectopic      Multiple      Live Births  3            Home Medications    Prior to Admission medications   Medication Sig Start Date End Date Taking? Authorizing Provider  acetaminophen (TYLENOL) 325 MG tablet Take 650 mg by mouth every 6 (six) hours as needed for mild pain.     [provider]  ibuprofen (ADVIL,MOTRIN) 600 MG tablet Take 1 tablet (600 mg total) by mouth every 6 (six) hours. Patient not taking: Reported on 05/30/2018 07/02/13   Sandrea Matte, MD  ibuprofen (ADVIL,MOTRIN) 800 MG tablet Take 1 tablet (800 mg total) by mouth 3 (three) times daily. Patient taking differently: Take 400 mg by mouth 3 (three) times daily as needed for headache.  09/18/15   Arby Barrette, MD  levonorgestrel (MIRENA) 20 MCG/24HR IUD 1 each by Intrauterine route once.    [provider]    Family History No family history on file.  Social  History Social History   Tobacco Use  . Smoking status: Never Smoker  . Smokeless tobacco: Never Used  Substance Use Topics  . Alcohol use: Yes    Frequency: Never    Comment: special occasions  . Drug use: Never     Allergies   Patient has no known allergies.   Review of Systems Review of Systems  All other systems reviewed and are negative.    Physical Exam Updated Vital Signs BP 127/76 (BP Location: Right Arm)   Pulse 88   Temp 99.6 F (37.6 C) (Oral)   Resp 16   Ht 5\' 1"  (1.549 m)   Wt 63 kg   SpO2 99%   BMI 26.26 kg/m   Physical Exam Vitals signs and nursing note reviewed.  Constitutional:      General: She is not in acute distress.    Appearance: She is well-developed.  HENT:     Head: Normocephalic and atraumatic.     Right Ear: Tympanic membrane normal.     Left Ear: Tympanic membrane normal.     Mouth/Throat:     Mouth: Mucous membranes are moist.     Pharynx: Posterior  oropharyngeal erythema present. No oropharyngeal exudate.  Eyes:     General:        Right eye: No discharge.        Left eye: No discharge.  Neck:     Musculoskeletal: Normal range of motion.  Cardiovascular:     Rate and Rhythm: Normal rate and regular rhythm.     Heart sounds: Normal heart sounds.  Pulmonary:     Effort: Pulmonary effort is normal.     Breath sounds: Normal breath sounds.  Abdominal:     General: There is no distension.     Palpations: Abdomen is soft.     Tenderness: There is no abdominal tenderness.  Musculoskeletal: Normal range of motion.  Skin:    General: Skin is warm and dry.  Neurological:     Mental Status: She is alert and oriented to person, place, and time.  Psychiatric:        Judgment: Judgment normal.      ED Treatments / Results  Labs (all labs ordered are listed, but only abnormal results are displayed) Labs Reviewed - No data to display  EKG None  Radiology Dg Chest 2 View  Result Date: 06/21/2018 CLINICAL DATA:  Fever,  sore throat. EXAM: CHEST - 2 VIEW COMPARISON:  05/30/2018 FINDINGS: Heart and mediastinal contours are within normal limits. No focal opacities or effusions. No acute bony abnormality. IMPRESSION: No active cardiopulmonary disease. Electronically Signed   By: Charlett Nose M.D.   On: 06/21/2018 09:19    Procedures Procedures (including critical care time)  Medications Ordered in ED Medications  ibuprofen (ADVIL,MOTRIN) tablet 600 mg (600 mg Oral Given 06/21/18 0845)  acetaminophen (TYLENOL) tablet 650 mg (650 mg Oral Given 06/21/18 0845)     Initial Impression / Assessment and Plan / ED Course  I have reviewed the triage vital signs and the nursing notes.  Pertinent labs & imaging results that were available during my care of the patient were reviewed by me and considered in my medical decision making (see chart for details).        Suspect influenza. patient is outside the window for medication.  Vital signs are stable.  Oral hydration now.  Chest x-ray to evaluate for infiltrate  Final Clinical Impressions(s) / ED Diagnoses   Final diagnoses:  None    ED Discharge Orders    None       Azalia Bilis, MD 06/21/18 [phone]

## 2018-06-21 NOTE — ED Notes (Signed)
ED Provider at bedside discussing test results and plan of care. 

## 2018-06-21 NOTE — ED Triage Notes (Signed)
Fever, sore throat, runny nose, and cough x4 days.

## 2018-08-16 ENCOUNTER — Ambulatory Visit (HOSPITAL_COMMUNITY)
Admission: EM | Admit: 2018-08-16 | Discharge: 2018-08-16 | Disposition: A | Discharge status: Home / Self Care | Payer: Self-pay | Attending: Family Medicine | Admitting: Family Medicine

## 2018-08-16 ENCOUNTER — Encounter (HOSPITAL_COMMUNITY): Payer: Self-pay

## 2018-08-16 ENCOUNTER — Other Ambulatory Visit: Payer: Self-pay

## 2018-08-16 DIAGNOSIS — K219 Gastro-esophageal reflux disease without esophagitis: Secondary | ICD-10-CM

## 2018-08-16 MED ORDER — OMEPRAZOLE 20 MG PO CPDR: 20.0000 mg | DELAYED_RELEASE_CAPSULE | Freq: Every day | ORAL | 1 refills | Status: DC

## 2018-08-16 NOTE — ED Triage Notes (Signed)
Pt presents with recurrent symptoms of acid reflux; chest pain after eating, indigestion after eating certain foods, and nausea associated with eating.

## 2018-08-16 NOTE — ED Provider Notes (Signed)
[facility]    CSN: [id number] Arrival date & time: 08/16/18  1159     History   Chief Complaint Chief Complaint  Patient presents with  . Gastroesophageal Reflux    HPI Rita Lynch is a 33 y.o. female.   HPI  Patient is here for heartburn.  She is been seen for this in the past.  She was diagnosed with GERD from the emergency room.  Told to take Tums.  She does not have a PCP.  She called to get an appointment with a PCP, however, she could not be seen because of the COVID-19 epidemic.  She hopes to have an appointment in the next couple of months.  She states that when she lays flat at night she gets a burning in the back of her throat.  It is made worse by spicy greasy foods.  She drinks alcohol very little.  She does take some ibuprofen.  No chest pain, no shortness of breath.  Past Medical History:  Diagnosis Date  . Pregnant 2014  . Umbilical hernia     Patient Active Problem List   Diagnosis Date Noted  . Active labor at term 07/01/2013    History reviewed. No pertinent surgical history.  OB History    Gravida  4   Para  4   Term  4   Preterm      AB      Living  4     SAB      TAB      Ectopic      Multiple      Live Births  3            Home Medications    Prior to Admission medications   Medication Sig Start Date End Date Taking? Authorizing Provider  acetaminophen (TYLENOL) 325 MG tablet Take 650 mg by mouth every 6 (six) hours as needed for mild pain.     [provider]  levonorgestrel (MIRENA) 20 MCG/24HR IUD 1 each by Intrauterine route once.    [provider]  omeprazole (PRILOSEC) 20 MG capsule Take 1 capsule (20 mg total) by mouth daily. 08/16/18   Eustace Moore, MD    Family History Family History  Family history unknown: Yes    Social History Social History   Tobacco Use  . Smoking status: Never Smoker  . Smokeless tobacco: Never Used  Substance Use Topics  . Alcohol use:  Yes    Frequency: Never    Comment: special occasions  . Drug use: Never     Allergies   Patient has no known allergies.   Review of Systems Review of Systems  Constitutional: Negative for chills and fever.  HENT: Negative for ear pain and sore throat.   Eyes: Negative for pain and visual disturbance.  Respiratory: Negative for cough and shortness of breath.   Cardiovascular: Negative for chest pain and palpitations.  Gastrointestinal: Positive for abdominal pain. Negative for vomiting.  Genitourinary: Negative for dysuria and hematuria.  Musculoskeletal: Negative for arthralgias and back pain.  Skin: Negative for color change and rash.  Neurological: Negative for seizures and syncope.  All other systems reviewed and are negative.    Physical Exam Triage Vital Signs ED Triage Vitals  Enc Vitals Group     BP 08/16/18 1216 101/61     Pulse Rate 08/16/18 1216 75     Resp 08/16/18 1216 16     Temp 08/16/18 1216 98.8 F (37.1  C)     Temp Source 08/16/18 1216 Oral     SpO2 08/16/18 1216 94 %     Weight --      Height --      Head Circumference --      Peak Flow --      Pain Score 08/16/18 1220 5     Pain Loc --      Pain Edu? --      Excl. in GC? --    No data found.  Updated Vital Signs BP 101/61 (BP Location: Left Arm)   Pulse 75   Temp 98.8 F (37.1 C) (Oral)   Resp 16   SpO2 94%   Visual Acuity Right Eye Distance:   Left Eye Distance:   Bilateral Distance:    Right Eye Near:   Left Eye Near:    Bilateral Near:     Physical Exam Constitutional:      General: She is not in acute distress.    Appearance: She is well-developed and normal weight.  HENT:     Head: Normocephalic and atraumatic.     Right Ear: Tympanic membrane and ear canal normal.     Left Ear: Tympanic membrane and ear canal normal.     Mouth/Throat:     Mouth: Mucous membranes are moist.  Eyes:     Conjunctiva/sclera: Conjunctivae normal.     Pupils: Pupils are equal, round, and  reactive to light.  Neck:     Musculoskeletal: Normal range of motion.  Cardiovascular:     Rate and Rhythm: Normal rate.  Pulmonary:     Effort: Pulmonary effort is normal. No respiratory distress.  Abdominal:     General: There is no distension.     Palpations: Abdomen is soft.     Tenderness: There is abdominal tenderness.     Comments: Epigastric  Musculoskeletal: Normal range of motion.  Skin:    General: Skin is warm and dry.  Neurological:     General: No focal deficit present.     Mental Status: She is alert.  Psychiatric:        Mood and Affect: Mood normal.        Thought Content: Thought content normal.      UC Treatments / Results  Labs (all labs ordered are listed, but only abnormal results are displayed) Labs Reviewed - No data to display  EKG None  Radiology No results found.  Procedures Procedures (including critical care time)  Medications Ordered in UC Medications - No data to display  Initial Impression / Assessment and Plan / UC Course  I have reviewed the triage vital signs and the nursing notes.  Pertinent labs & imaging results that were available during my care of the patient were reviewed by me and considered in my medical decision making (see chart for details).     Discussed acid reflux.  Trial of omeprazole.  Gave her written information about foods to avoid.  Avoid alcohol.  Avoid NSAID. Final Clinical Impressions(s) / UC Diagnoses   Final diagnoses:  Gastroesophageal reflux disease, esophagitis presence not specified     Discharge Instructions     I have given you written information regarding acid reflux Take the omeprazole 1 pill a day.  Take on an empty stomach.  This will reduce stomach acid and heartburn Elevate head of bed Avoid foods that aggravate your condition Follow-up with your primary care doctor   ED Prescriptions    Medication Sig Dispense  Auth. Provider   omeprazole (PRILOSEC) 20 MG capsule Take 1  capsule (20 mg total) by mouth daily. 30 capsule Eustace Moore, MD     Controlled Substance Prescriptions [place] Controlled Substance Registry consulted? Not Applicable   Eustace Moore, MD 08/16/18 1316

## 2018-08-16 NOTE — Discharge Instructions (Signed)
I have given you written information regarding acid reflux Take the omeprazole 1 pill a day.  Take on an empty stomach.  This will reduce stomach acid and heartburn Elevate head of bed Avoid foods that aggravate your condition Follow-up with your primary care doctor

## 2018-10-08 ENCOUNTER — Telehealth: Payer: Self-pay | Admitting: Primary Care

## 2018-10-16 ENCOUNTER — Other Ambulatory Visit: Payer: Self-pay

## 2018-10-16 ENCOUNTER — Ambulatory Visit (INDEPENDENT_AMBULATORY_CARE_PROVIDER_SITE_OTHER): Payer: Self-pay | Admitting: Primary Care

## 2018-10-16 ENCOUNTER — Encounter (INDEPENDENT_AMBULATORY_CARE_PROVIDER_SITE_OTHER): Payer: Self-pay | Admitting: Primary Care

## 2018-10-16 VITALS — BP 104/61 | HR 75 | Temp 99.0°F | Ht 61.0 in | Wt 127.4 lb

## 2018-10-16 DIAGNOSIS — K429 Umbilical hernia without obstruction or gangrene: Secondary | ICD-10-CM

## 2018-10-16 DIAGNOSIS — K2100 Gastro-esophageal reflux disease with esophagitis, without bleeding: Secondary | ICD-10-CM

## 2018-10-16 DIAGNOSIS — K5900 Constipation, unspecified: Secondary | ICD-10-CM

## 2018-10-16 DIAGNOSIS — Z7689 Persons encountering health services in other specified circumstances: Secondary | ICD-10-CM

## 2018-10-16 DIAGNOSIS — R634 Abnormal weight loss: Secondary | ICD-10-CM

## 2018-10-16 DIAGNOSIS — R5383 Other fatigue: Secondary | ICD-10-CM

## 2018-10-16 MED ORDER — SENNA 8.6 MG PO TABS: 1.0000 | ORAL_TABLET | Freq: Every day | ORAL | 0 refills | Status: DC

## 2018-10-16 NOTE — Progress Notes (Signed)
LOGO@  Subjective:  Patient ID: Rita Lynch, female    DOB: 1985-06-26  Age: 33 y.o. MRN: [id number]  CC: Thyroid Problem (follow up from mychart visit )   HPI  Rita MeckelYolanda Lynch presents for to establish care she has recently lost weight 15 lbs in 3 months unintentional , hair loss, increased stress and new diagnosis of GERD. Past Medical History:  Diagnosis Date  . Pregnant 2014  . Umbilical hernia     History reviewed. No pertinent surgical history.  Family History  Family history unknown: Yes    Social History   Tobacco Use  . Smoking status: Never Smoker  . Smokeless tobacco: Never Used  Substance Use Topics  . Alcohol use: Yes    Frequency: Never    Comment: special occasions    ROS Review of Systems  Constitutional: Positive for fatigue and unexpected weight change.  HENT: Positive for sore throat.   Eyes: Negative.   Respiratory: Positive for shortness of breath.        With acid reflux  Cardiovascular: Negative.   Gastrointestinal: Negative.   Endocrine: Negative.   Genitourinary: Negative.   Musculoskeletal: Negative.   Skin: Negative.   Allergic/Immunologic: Negative.   Neurological: Positive for dizziness.  Hematological: Negative.   Psychiatric/Behavioral: The patient is nervous/anxious.     Objective:   Today's Vitals: BP 104/61 (BP Location: Left Arm, Patient Position: Sitting, Cuff Size: Normal)   Pulse 75   Temp 99 F (37.2 C) (Oral)   Ht 5\' 1"  (1.549 m)   Wt 127 lb 6.4 oz (57.8 kg)   SpO2 96%   Breastfeeding No   BMI 24.07 kg/m   Physical Exam Constitutional:      Appearance: Normal appearance.  HENT:     Head: Normocephalic.     Right Ear: Tympanic membrane normal.     Left Ear: Tympanic membrane normal.     Nose: Nose normal.  Eyes:     Extraocular Movements: Extraocular movements intact.  Neck:     Musculoskeletal: Normal range of motion.  Cardiovascular:     Rate and Rhythm: Normal rate and regular rhythm.     Heart sounds: Normal heart sounds.  Pulmonary:     Effort: Pulmonary effort is normal.     Breath sounds: Normal breath sounds.  Abdominal:     General: Abdomen is flat. Bowel sounds are normal.     Hernia: A hernia is present.  Musculoskeletal: Normal range of motion.  Skin:    General: Skin is warm and dry.  Neurological:     General: No focal deficit present.     Mental Status: She is alert.  Psychiatric:        Mood and Affect: Mood normal.     Assessment & Plan:   Problem List Items Addressed This Visit    None    Visit Diagnoses    Encounter to establish care    -  Primary   Gastroesophageal reflux disease with esophagitis       Loss of weight       Relevant Orders   TSH + free T4 (Completed)   Umbilical hernia without obstruction and without gangrene       Fatigue, unspecified type       Relevant Orders   TSH + free T4 (Completed)   Constipation, unspecified constipation type        Rita LagerYolanda was seen today for thyroid problem.  Diagnoses and all orders for this visit:  Encounter  to establish care No PCP on record has presented to the emergency room February 13, 2020For sore throat, June 21, 2018 flulike symptoms checks x-ray completed no active, Aug 16, 2018 presented to urgent care/ED for gastro-reflux recommended and referred for PCP  Gastroesophageal reflux disease with esophagitis gastro-esophageal reflux symptoms improved with omeprazole only uses as needed.  Symptoms burning sensation, acid reflux, chest pain not tomorrow/is positional pain and acid relief.  Lifestyle modification includes weight loss tobacco sensation diet changes to include small meals avoiding late night meals and lying down raising the head of the bed and stress reduction.  Loss of weight Unintentional weight loss and hair with episodes of fatigue will rule out thyroid disease with labs.  Umbilical hernia without obstruction and without gangrene On exam there is a dime size umbilical  hernia more prominent when laying down and pushing does not create a problem and denies pain unless pressed upon will monitor.  Fatigue, unspecified type Will rule out thyroid origin with TSH and T4 other contributing factors are weight loss and hair loss.   Outpatient Encounter Medications as of 10/16/2018  Medication Sig  . acetaminophen (TYLENOL) 325 MG tablet Take 650 mg by mouth every 6 (six) hours as needed for mild pain.   Marland Kitchen levonorgestrel (MIRENA) 20 MCG/24HR IUD 1 each by Intrauterine route once.  Marland Kitchen omeprazole (PRILOSEC) 20 MG capsule Take 1 capsule (20 mg total) by mouth daily.  Marland Kitchen senna (SENOKOT) 8.6 MG TABS tablet Take 1 tablet (8.6 mg total) by mouth daily.   No facility-administered encounter medications on file as of 10/16/2018.     Follow-up: Return if symptoms worsen or fail to improve.    Kerin Perna NP

## 2018-10-16 NOTE — Patient Instructions (Signed)

## 2018-10-17 ENCOUNTER — Other Ambulatory Visit (INDEPENDENT_AMBULATORY_CARE_PROVIDER_SITE_OTHER): Payer: Self-pay

## 2018-10-18 LAB — TSH+FREE T4
Free T4: 1.15 ng/dL (ref 0.82–1.77)
TSH: 2.85 u[IU]/mL (ref 0.450–4.500)

## 2018-10-23 ENCOUNTER — Telehealth (INDEPENDENT_AMBULATORY_CARE_PROVIDER_SITE_OTHER): Payer: Self-pay

## 2018-10-23 NOTE — Telephone Encounter (Signed)
Patient verified DOB. She is aware that thyroid is normal. Nat Christen, CMA

## 2018-10-23 NOTE — Telephone Encounter (Signed)
-----   Message from Kerin Perna, NP sent at 10/19/2018  5:23 PM EDT ----- Thyroids test is normal

## 2019-11-17 ENCOUNTER — Encounter: Payer: Self-pay | Admitting: Family Medicine

## 2019-12-24 ENCOUNTER — Emergency Department (HOSPITAL_BASED_OUTPATIENT_CLINIC_OR_DEPARTMENT_OTHER): Payer: Self-pay

## 2019-12-24 ENCOUNTER — Emergency Department (HOSPITAL_BASED_OUTPATIENT_CLINIC_OR_DEPARTMENT_OTHER)
Admission: EM | Admit: 2019-12-24 | Discharge: 2019-12-24 | Disposition: A | Discharge status: Home / Self Care | Payer: Self-pay | Attending: Emergency Medicine | Admitting: Emergency Medicine

## 2019-12-24 ENCOUNTER — Encounter (HOSPITAL_BASED_OUTPATIENT_CLINIC_OR_DEPARTMENT_OTHER): Payer: Self-pay

## 2019-12-24 ENCOUNTER — Other Ambulatory Visit: Payer: Self-pay

## 2019-12-24 DIAGNOSIS — R079 Chest pain, unspecified: Secondary | ICD-10-CM

## 2019-12-24 DIAGNOSIS — R0602 Shortness of breath: Secondary | ICD-10-CM | POA: Insufficient documentation

## 2019-12-24 DIAGNOSIS — R0789 Other chest pain: Secondary | ICD-10-CM | POA: Insufficient documentation

## 2019-12-24 DIAGNOSIS — Z20822 Contact with and (suspected) exposure to covid-19: Secondary | ICD-10-CM | POA: Insufficient documentation

## 2019-12-24 DIAGNOSIS — R070 Pain in throat: Secondary | ICD-10-CM | POA: Insufficient documentation

## 2019-12-24 LAB — BASIC METABOLIC PANEL
Anion gap: 11 (ref 5–15)
BUN: 13 mg/dL (ref 6–20)
CO2: 24 mmol/L (ref 22–32)
Calcium: 9.2 mg/dL (ref 8.9–10.3)
Chloride: 103 mmol/L (ref 98–111)
Creatinine, Ser: 0.8 mg/dL (ref 0.44–1.00)
GFR calc Af Amer: >60 mL/min (ref >60)
GFR calc non Af Amer: >60 mL/min (ref >60)
Glucose, Bld: 96 mg/dL (ref 70–99)
Potassium: 3.5 mmol/L (ref 3.5–5.1)
Sodium: 138 mmol/L (ref 135–145)

## 2019-12-24 LAB — CBC
HCT: 39.1 % (ref 36.0–46.0)
Hemoglobin: 12.7 g/dL (ref 12.0–15.0)
MCH: 28.2 pg (ref 26.0–34.0)
MCHC: 32.5 g/dL (ref 30.0–36.0)
MCV: 86.9 fL (ref 80.0–100.0)
Platelets: 183 10*3/uL (ref 150–400)
RBC: 4.5 MIL/uL (ref 3.87–5.11)
RDW: 12.9 % (ref 11.5–15.5)
WBC: 5.6 10*3/uL (ref 4.0–10.5)
nRBC: 0 % (ref 0.0–0.2)

## 2019-12-24 LAB — T4, FREE: Free T4: 1.01 ng/dL (ref 0.61–1.12)

## 2019-12-24 LAB — GROUP A STREP BY PCR: Group A Strep by PCR: NOT DETECTED

## 2019-12-24 LAB — TROPONIN I (HIGH SENSITIVITY)
Troponin I (High Sensitivity): <2 ng/L (ref <18)
Troponin I (High Sensitivity): <2 ng/L (ref <18)

## 2019-12-24 LAB — SARS CORONAVIRUS 2 BY RT PCR (HOSPITAL ORDER, PERFORMED IN ~~LOC~~ HOSPITAL LAB): SARS Coronavirus 2: NEGATIVE

## 2019-12-24 LAB — PREGNANCY, URINE: Preg Test, Ur: NEGATIVE

## 2019-12-24 LAB — TSH: TSH: 2.467 u[IU]/mL (ref 0.350–4.500)

## 2019-12-24 MED ORDER — OMEPRAZOLE 20 MG PO CPDR: 20.0000 mg | DELAYED_RELEASE_CAPSULE | Freq: Every day | ORAL | 1 refills | Status: DC

## 2019-12-24 NOTE — ED Provider Notes (Signed)
[facility] EMERGENCY DEPARTMENT Provider Note   CSN: [id number] Arrival date & time: 12/24/19  1042     History Chief Complaint  Patient presents with  . Shortness of Breath    Rita Lynch is a 34 y.o. female.  She is complaining of 4 days of pain in her throat which causes her to feel anxious that is closing and then she gets short of breath with some intermittent chest pain.  No fevers or chills.  No nausea vomiting diarrhea.  No loss of taste or smell.  Has not been vaccinated.  She had similar symptoms last year and had a cardiac work-up.  It was felt to be reflux and she was put on acid medication.  At that time she was having some burning in her upper abdomen.  She is not having that burning now also she has not been taking her acid medication.  Non-smoker.  No sick contacts or recent travel.  The history is provided by the patient.  Shortness of Breath Severity:  Moderate Onset quality:  Sudden Timing:  Intermittent Progression:  Unchanged Chronicity:  Recurrent Relieved by:  Nothing Worsened by:  Nothing Ineffective treatments:  None tried Associated symptoms: chest pain, neck pain and sore throat   Associated symptoms: no abdominal pain, no cough, no fever, no headaches, no hemoptysis, no rash, no sputum production and no vomiting        Past Medical History:  Diagnosis Date  . Pregnant 2014  . Umbilical hernia     Patient Active Problem List   Diagnosis Date Noted  . Active labor at term 07/01/2013    History reviewed. No pertinent surgical history.   OB History    Gravida  4   Para  4   Term  4   Preterm      AB      Living  4     SAB      TAB      Ectopic      Multiple      Live Births  3           Family History  Family history unknown: Yes    Social History   Tobacco Use  . Smoking status: Never Smoker  . Smokeless tobacco: Never Used  Vaping Use  . Vaping Use: Never used  Substance Use Topics  .  Alcohol use: Yes    Comment: occ  . Drug use: Never    Home Medications Prior to Admission medications   Medication Sig Start Date End Date Taking? Authorizing Provider  acetaminophen (TYLENOL) 325 MG tablet Take 650 mg by mouth every 6 (six) hours as needed for mild pain.     [provider]  levonorgestrel (MIRENA) 20 MCG/24HR IUD 1 each by Intrauterine route once.    [provider]  omeprazole (PRILOSEC) 20 MG capsule Take 1 capsule (20 mg total) by mouth daily. 08/16/18   Eustace Moore, MD  senna (SENOKOT) 8.6 MG TABS tablet Take 1 tablet (8.6 mg total) by mouth daily. 10/16/18   Grayce Sessions, NP    Allergies    Patient has no known allergies.  Review of Systems   Review of Systems  Constitutional: Negative for fever.  HENT: Positive for sore throat.   Eyes: Negative for visual disturbance.  Respiratory: Positive for shortness of breath. Negative for cough, hemoptysis and sputum production.   Cardiovascular: Positive for chest pain.  Gastrointestinal: Negative for abdominal pain and  vomiting.  Genitourinary: Negative for dysuria.  Musculoskeletal: Positive for neck pain.  Skin: Negative for rash.  Neurological: Negative for headaches.    Physical Exam Updated Vital Signs BP 118/80 (BP Location: Left Arm)   Pulse 70   Temp 98.9 F (37.2 C) (Oral)   Resp 20   Ht 5\' 1"  (1.549 m)   Wt 57.6 kg   LMP 12/20/2019   SpO2 100%   BMI 24.00 kg/m   Physical Exam Vitals and nursing note reviewed.  Constitutional:      General: She is not in acute distress.    Appearance: She is well-developed.  HENT:     Head: Normocephalic and atraumatic.     Mouth/Throat:     Mouth: Mucous membranes are moist.     Pharynx: Oropharynx is clear. No oropharyngeal exudate.  Eyes:     Conjunctiva/sclera: Conjunctivae normal.  Cardiovascular:     Rate and Rhythm: Normal rate and regular rhythm.     Heart sounds: No murmur heard.   Pulmonary:     Effort:  Pulmonary effort is normal. No respiratory distress.     Breath sounds: Normal breath sounds.  Abdominal:     Palpations: Abdomen is soft.     Tenderness: There is no abdominal tenderness.  Musculoskeletal:        General: Normal range of motion.     Cervical back: Normal range of motion and neck supple.     Right lower leg: No tenderness.     Left lower leg: No tenderness.  Skin:    General: Skin is warm and dry.     Capillary Refill: Capillary refill takes less than 2 seconds.  Neurological:     General: No focal deficit present.     Mental Status: She is alert.     ED Results / Procedures / Treatments   Labs (all labs ordered are listed, but only abnormal results are displayed) Labs Reviewed  GROUP A STREP BY PCR  SARS CORONAVIRUS 2 BY RT PCR (HOSPITAL ORDER, PERFORMED IN [facility])  BASIC METABOLIC PANEL  CBC  PREGNANCY, URINE  TSH  T4, FREE  T3, FREE  TROPONIN I (HIGH SENSITIVITY)  TROPONIN I (HIGH SENSITIVITY)    EKG EKG Interpretation  Date/Time:  Wednesday December 24 2019 11:41:06 EDT Ventricular Rate:  72 PR Interval:  146 QRS Duration: 78 QT Interval:  394 QTC Calculation: 431 R Axis:   95 Text Interpretation: Normal sinus rhythm Rightward axis Nonspecific T wave abnormality Abnormal ECG No significant change since prior 2/20 Confirmed by 3/20 [phone]) on 12/24/2019 11:46:46 AM   Radiology DG Chest 2 View  Result Date: 12/24/2019 CLINICAL DATA:  Chest pain and shortness of breath EXAM: CHEST - 2 VIEW COMPARISON:  Chest x-ray 06/21/2018 FINDINGS: The heart size and mediastinal contours are within normal limits. Both lungs are clear. No acute osseous abnormality. Mild multilevel degenerative changes of the spine. IMPRESSION: No active cardiopulmonary disease. Electronically Signed   By: 08/21/2018 M.D.   On: 12/24/2019 12:00    Procedures Procedures (including critical care time)  Medications Ordered in ED Medications - No  data to display  ED Course  I have reviewed the triage vital signs and the nursing notes.  Pertinent labs & imaging results that were available during my care of the patient were reviewed by me and considered in my medical decision making (see chart for details).  Clinical Course as of Dec 23 1756  Wed Dec 24, 2019  1328 Chest x-ray interpreted by me as no acute infiltrates.   [MB]    Clinical Course User Index [MB] Terrilee Files, MD   MDM Rules/Calculators/A&P                         Raziah Funnell was evaluated in Emergency Department on 12/24/2019 for the symptoms described in the history of present illness. She was evaluated in the context of the global COVID-19 pandemic, which necessitated consideration that the patient might be at risk for infection with the SARS-CoV-2 virus that causes COVID-19. Institutional protocols and algorithms that pertain to the evaluation of patients at risk for COVID-19 are in a state of rapid change based on information released by regulatory bodies including the CDC and federal and state organizations. These policies and algorithms were followed during the patient's care in the ED.  This patient complains of throat pain, shortness of breath, chest pain; this involves an extensive number of treatment Options and is a complaint that carries with it a high risk of complications and Morbidity. The differential includes esophageal spasm, reflux, anxiety, ACS, Covid, strep, goiter  I ordered, reviewed and interpreted labs, which included CBC with normal white count normal hemoglobin, chemistries normal, delta troponin negative, Covid testing negative, strep testing negative, thyroid studies partially back and normal, pregnancy test negative I ordered imaging studies which included chest x-ray and I independently    visualized and interpreted imaging which showed no acute findings Previous records obtained and reviewed in epic, prior visit last year for  similar presentation and unremarkable work-up.  After the interventions stated above, I reevaluated the patient and found patient to be satting well on room air in no distress.  I signed her out to oncoming provider Dr. Silverio Lay to follow-up on second troponin and strep test.  Anticipate patient will be discharged and restarted on her omeprazole.  Also encouraged her to follow-up with her PCP.  Return instructions discussed.   Final Clinical Impression(s) / ED Diagnoses Final diagnoses:  Throat pain in adult  SOB (shortness of breath)  Nonspecific chest pain    Rx / DC Orders ED Discharge Orders         Ordered    omeprazole (PRILOSEC) 20 MG capsule  Daily        12/24/19 1409           Terrilee Files, MD 12/24/19 1801

## 2019-12-24 NOTE — Discharge Instructions (Addendum)
You were seen in the emergency department for evaluation of pain in your throat shortness of breath and chest pain.  You had blood work EKG and a chest x-ray that did not show any serious findings.  You were also tested for Covid and strep throat and both were negative.  Your symptoms may be explained by reflux disease.  We are starting you on some acid medication (prilosec).  Please take this on a daily basis.  Please follow-up with your primary care doctor.  Return to the emergency department for any worsening or concerning symptoms.

## 2019-12-24 NOTE — ED Provider Notes (Signed)
  Physical Exam  BP 118/80 (BP Location: Left Arm)   Pulse 70   Temp 98.9 F (37.2 C) (Oral)   Resp 20   Ht 5\' 1"  (1.549 m)   Wt 57.6 kg   LMP 12/20/2019   SpO2 100%   BMI 24.00 kg/m   Physical Exam  ED Course/Procedures   Clinical Course as of Dec 23 1613  Wed Dec 24, 2019  1328 Chest x-ray interpreted by me as no acute infiltrates.   [MB]    Clinical Course User Index [MB] Dec 26, 2019, MD    Procedures  MDM  Care assumed at 3 pm. Patient has chest pain. Sign out pending COVID and second trop.   4:16 PM Second trop negative, COVID negative. Stable for discharge      Terrilee Files, MD 12/24/19 [phone]

## 2019-12-24 NOTE — ED Notes (Signed)
Sob x 2 weeks  And neck pain hurts to talk she states , no n/v/d or h/a . Sob comes and goes , sometimes she feels anxious she states , states is not vaccinated  States she she has panic attacks with the pain in her throat

## 2019-12-24 NOTE — ED Triage Notes (Addendum)
Pt c/o SOB and pain to anterior neck-states she had same last year and was evaluated for thyroid issues-pt denies fever/flu sx-states she does have intermittent CP x 4 days-NAD-steady gait-NAD-steady gait

## 2019-12-24 NOTE — ED Notes (Signed)
Assumed care for 30 minute lunch For [facility]

## 2019-12-25 LAB — T3, FREE: T3, Free: 2.7 pg/mL (ref 2.0–4.4)

## 2019-12-29 ENCOUNTER — Ambulatory Visit: Payer: Self-pay | Attending: Family Medicine | Admitting: Family Medicine

## 2019-12-29 ENCOUNTER — Encounter: Payer: Self-pay | Admitting: Gastroenterology

## 2019-12-29 ENCOUNTER — Encounter: Payer: Self-pay | Admitting: Family Medicine

## 2019-12-29 ENCOUNTER — Other Ambulatory Visit: Payer: Self-pay

## 2019-12-29 VITALS — BP 95/61 | HR 76 | Ht 61.0 in | Wt 125.0 lb

## 2019-12-29 DIAGNOSIS — Z0000 Encounter for general adult medical examination without abnormal findings: Secondary | ICD-10-CM

## 2019-12-29 DIAGNOSIS — Z124 Encounter for screening for malignant neoplasm of cervix: Secondary | ICD-10-CM

## 2019-12-29 DIAGNOSIS — K2100 Gastro-esophageal reflux disease with esophagitis, without bleeding: Secondary | ICD-10-CM

## 2019-12-29 DIAGNOSIS — Z1159 Encounter for screening for other viral diseases: Secondary | ICD-10-CM

## 2019-12-29 DIAGNOSIS — Z13228 Encounter for screening for other metabolic disorders: Secondary | ICD-10-CM

## 2019-12-29 NOTE — Progress Notes (Addendum)
Subjective:  Patient ID: Rita Lynch, female    DOB: 1985-09-25  Age: 34 y.o. MRN: [id number]  CC: Annual Exam and Gynecologic Exam   HPI Apple Dearmas presents for a complete physical exam. She is due for Pap smear today. Also requests GI referral due to uncontrolled GI symptoms.  Seen at the ED for GERD last week and placed on Protonix which she states she was treated with last year with no improvement in her symptoms  Complains of throat pain and difficulty swalling, greasy foods, sweets, alcohol worsen her symptoms and Protonix has been ineffectve and symptoms have been present for the last 2 months and is sometimes associated with dyspnea Past Medical History:  Diagnosis Date  . Pregnant 2014  . Umbilical hernia     History reviewed. No pertinent surgical history.  Family History  Family history unknown: Yes    No Known Allergies  Outpatient Medications Prior to Visit  Medication Sig Dispense Refill  . acetaminophen (TYLENOL) 325 MG tablet Take 650 mg by mouth every 6 (six) hours as needed for mild pain.     Marland Kitchen omeprazole (PRILOSEC) 20 MG capsule Take 1 capsule (20 mg total) by mouth daily. 30 capsule 1  . senna (SENOKOT) 8.6 MG TABS tablet Take 1 tablet (8.6 mg total) by mouth daily. 120 tablet 0  . levonorgestrel (MIRENA) 20 MCG/24HR IUD 1 each by Intrauterine route once. (Patient not taking: Reported on 12/29/2019)     No facility-administered medications prior to visit.     ROS Review of Systems  Constitutional: Negative for activity change, appetite change and fatigue.  HENT: Negative for congestion, sinus pressure and sore throat.   Eyes: Negative for visual disturbance.  Respiratory: Negative for cough, chest tightness, shortness of breath and wheezing.   Cardiovascular: Negative for chest pain and palpitations.  Gastrointestinal: Negative for abdominal distention, abdominal pain and constipation.  Endocrine: Negative for polydipsia.    Genitourinary: Negative for dysuria and frequency.  Musculoskeletal: Negative for arthralgias and back pain.  Skin: Negative for rash.  Neurological: Negative for tremors, light-headedness and numbness.  Hematological: Does not bruise/bleed easily.  Psychiatric/Behavioral: Negative for agitation and behavioral problems.    Objective:  BP 95/61   Pulse 76   Ht 5\' 1"  (1.549 m)   Wt 125 lb (56.7 kg)   LMP 12/20/2019   SpO2 99%   BMI 23.62 kg/m   BP/Weight 12/29/2019 12/24/2019 10/16/2018  Systolic BP 95 109 104  Diastolic BP 61 67 61  Wt. (Lbs) 125 127 127.4  BMI 23.62 24 24.07      Physical Exam Constitutional:      Appearance: She is well-developed.  Neck:     Vascular: No JVD.  Cardiovascular:     Rate and Rhythm: Normal rate.     Heart sounds: Normal heart sounds. No murmur heard.   Pulmonary:     Effort: Pulmonary effort is normal.     Breath sounds: Normal breath sounds. No wheezing or rales.  Chest:     Chest wall: No tenderness.     Breasts:        Right: No mass or tenderness.        Left: No mass or tenderness.  Abdominal:     General: Bowel sounds are normal. There is no distension.     Palpations: Abdomen is soft. There is no mass.     Tenderness: There is no abdominal tenderness.  Genitourinary:    Comments: External genitalia, vagina, cervix-normal  Musculoskeletal:        General: Normal range of motion.     Right lower leg: No edema.     Left lower leg: No edema.  Neurological:     Mental Status: She is alert and oriented to person, place, and time.  Psychiatric:        Mood and Affect: Mood normal.     CMP Latest Ref Rng & Units 12/24/2019 05/30/2018 04/05/2018  Glucose 70 - 99 mg/dL 96 83 242(P)  BUN 6 - 20 mg/dL 13 8 11   Creatinine 0.44 - 1.00 mg/dL 5.36 1.44  Sodium 135 - 145 mmol/L 138 139 139  Potassium 3.5 - 5.1 mmol/L 3.5 4.0 3.6  Chloride 98 - 111 mmol/L 103 107 110  CO2 22 - 32 mmol/L 24 26 22   Calcium 8.9 - 10.3 mg/dL 9.2 9.4  9.1    Lipid Panel  No results found for: CHOL, TRIG, HDL, CHOLHDL, VLDL, LDLCALC, LDLDIRECT  CBC    Component Value Date/Time   WBC 5.6 12/24/2019 1147   RBC 4.50 12/24/2019 1147   HGB 12.7 12/24/2019 1147   HCT 39.1 12/24/2019 1147   PLT 183 12/24/2019 1147   MCV 86.9 12/24/2019 1147   MCH 28.2 12/24/2019 1147   MCHC 32.5 12/24/2019 1147   RDW 12.9 12/24/2019 1147   LYMPHSABS 1.5 04/05/2018 1719   MONOABS 0.7 04/05/2018 1719   EOSABS 0.1 04/05/2018 1719   BASOSABS 0.0 04/05/2018 1719    No results found for: HGBA1C  Assessment & Plan:  1. Annual physical exam Counseled on 150 minutes of exercise per week, healthy eating (including decreased daily intake of saturated fats, cholesterol, added sugars, sodium), STI prevention, routine healthcare maintenance.   2. Screening for metabolic disorder - Lipid panel  3. Gastroesophageal reflux disease with esophagitis without hemorrhage Uncontrolled - Ambulatory referral to Gastroenterology  4. Screening for viral disease - HCV RNA quant rflx ultra or genotyp([facility]) - HIV Antibody (routine testing w rflx)  5. Screening for cervical cancer - Cytology - PAP([place])    No orders of the defined types were placed in this encounter.   Follow-up: Return in about 1 year (around 12/28/2020) for annual physical exam.       04/07/2018, MD, FAAFP. [facility] [place], [facility] [place]   12/29/2019, 12:53 PM

## 2019-12-29 NOTE — Progress Notes (Signed)
Having trouble with throat, states that her voice has changed.

## 2019-12-29 NOTE — Patient Instructions (Signed)
Health Maintenance, Female Adopting a healthy lifestyle and getting preventive care are important in promoting health and wellness. Ask your health care provider about:  The right schedule for you to have regular tests and exams.  Things you can do on your own to prevent diseases and keep yourself healthy. What should I know about diet, weight, and exercise? Eat a healthy diet   Eat a diet that includes plenty of vegetables, fruits, low-fat dairy products, and lean protein.  Do not eat a lot of foods that are high in solid fats, added sugars, or sodium. Maintain a healthy weight Body mass index (BMI) is used to identify weight problems. It estimates body fat based on height and weight. Your health care provider can help determine your BMI and help you achieve or maintain a healthy weight. Get regular exercise Get regular exercise. This is one of the most important things you can do for your health. Most adults should:  Exercise for at least 150 minutes each week. The exercise should increase your heart rate and make you sweat (moderate-intensity exercise).  Do strengthening exercises at least twice a week. This is in addition to the moderate-intensity exercise.  Spend less time sitting. Even light physical activity can be beneficial. Watch cholesterol and blood lipids Have your blood tested for lipids and cholesterol at 35 years of age, then have this test every 5 years. Have your cholesterol levels checked more often if:  Your lipid or cholesterol levels are high.  You are older than 35 years of age.  You are at high risk for heart disease. What should I know about cancer screening? Depending on your health history and family history, you may need to have cancer screening at various ages. This may include screening for:  Breast cancer.  Cervical cancer.  Colorectal cancer.  Skin cancer.  Lung cancer. What should I know about heart disease, diabetes, and high blood  pressure? Blood pressure and heart disease  High blood pressure causes heart disease and increases the risk of stroke. This is more likely to develop in people who have high blood pressure readings, are of African descent, or are overweight.  Have your blood pressure checked: ? Every 3-5 years if you are 18-39 years of age. ? Every year if you are 40 years old or older. Diabetes Have regular diabetes screenings. This checks your fasting blood sugar level. Have the screening done:  Once every three years after age 40 if you are at a normal weight and have a low risk for diabetes.  More often and at a younger age if you are overweight or have a high risk for diabetes. What should I know about preventing infection? Hepatitis B If you have a higher risk for hepatitis B, you should be screened for this virus. Talk with your health care provider to find out if you are at risk for hepatitis B infection. Hepatitis C Testing is recommended for:  Everyone born from 1945 through 1965.  Anyone with known risk factors for hepatitis C. Sexually transmitted infections (STIs)  Get screened for STIs, including gonorrhea and chlamydia, if: ? You are sexually active and are younger than 35 years of age. ? You are older than 35 years of age and your health care provider tells you that you are at risk for this type of infection. ? Your sexual activity has changed since you were last screened, and you are at increased risk for chlamydia or gonorrhea. Ask your health care provider if   you are at risk.  Ask your health care provider about whether you are at high risk for HIV. Your health care provider may recommend a prescription medicine to help prevent HIV infection. If you choose to take medicine to prevent HIV, you should first get tested for HIV. You should then be tested every 3 months for as long as you are taking the medicine. Pregnancy  If you are about to stop having your period (premenopausal) and  you may become pregnant, seek counseling before you get pregnant.  Take 400 to 800 micrograms (mcg) of folic acid every day if you become pregnant.  Ask for birth control (contraception) if you want to prevent pregnancy. Osteoporosis and menopause Osteoporosis is a disease in which the bones lose minerals and strength with aging. This can result in bone fractures. If you are 65 years old or older, or if you are at risk for osteoporosis and fractures, ask your health care provider if you should:  Be screened for bone loss.  Take a calcium or vitamin D supplement to lower your risk of fractures.  Be given hormone replacement therapy (HRT) to treat symptoms of menopause. Follow these instructions at home: Lifestyle  Do not use any products that contain nicotine or tobacco, such as cigarettes, e-cigarettes, and chewing tobacco. If you need help quitting, ask your health care provider.  Do not use street drugs.  Do not share needles.  Ask your health care provider for help if you need support or information about quitting drugs. Alcohol use  Do not drink alcohol if: ? Your health care provider tells you not to drink. ? You are pregnant, may be pregnant, or are planning to become pregnant.  If you drink alcohol: ? Limit how much you use to 0-1 drink a day. ? Limit intake if you are breastfeeding.  Be aware of how much alcohol is in your drink. In the [place] one drink equals one 12 oz bottle of beer (355 mL), one 5 oz glass of wine (148 mL), or one 1 oz glass of hard liquor (44 mL). General instructions  Schedule regular health, dental, and eye exams.  Stay current with your vaccines.  Tell your health care provider if: ? You often feel depressed. ? You have ever been abused or do not feel safe at home. Summary  Adopting a healthy lifestyle and getting preventive care are important in promoting health and wellness.  Follow your health care provider's instructions about healthy  diet, exercising, and getting tested or screened for diseases.  Follow your health care provider's instructions on monitoring your cholesterol and blood pressure. This information is not intended to replace advice given to you by your health care provider. Make sure you discuss any questions you have with your health care provider. Document Revised: 03/27/2018 Document Reviewed: 03/27/2018 Elsevier Patient Education  2020 [employer]  

## 2019-12-30 LAB — HCV RNA QUANT RFLX ULTRA OR GENOTYP: HCV Quant Baseline: NOT DETECTED IU/mL

## 2019-12-30 LAB — LIPID PANEL
Chol/HDL Ratio: 2.6 ratio (ref 0.0–4.4)
Cholesterol, Total: 173 mg/dL (ref 100–199)
HDL: 66 mg/dL (ref >39)
LDL Chol Calc (NIH): 94 mg/dL (ref 0–99)
Triglycerides: 70 mg/dL (ref 0–149)
VLDL Cholesterol Cal: 13 mg/dL (ref 5–40)

## 2019-12-30 LAB — HIV ANTIBODY (ROUTINE TESTING W REFLEX): HIV Screen 4th Generation wRfx: NONREACTIVE

## 2020-01-01 ENCOUNTER — Telehealth: Payer: Self-pay

## 2020-01-01 LAB — CYTOLOGY - PAP
Adequacy: ABSENT
Comment: NEGATIVE
Diagnosis: UNDETERMINED — AB
High risk HPV: NEGATIVE

## 2020-01-01 NOTE — Telephone Encounter (Signed)
-----   Message from Hoy Register, MD sent at 01/01/2020  1:24 PM EDT ----- Pap smear reveals atypical cells which are neither normal nor abnormal. I would recommend a repeat in 1 year to evaluate for resolution.

## 2020-01-01 NOTE — Telephone Encounter (Signed)
Patient name and DOB has been verified Patient was informed of lab results. Patient had no questions.  

## 2020-01-28 ENCOUNTER — Ambulatory Visit (HOSPITAL_COMMUNITY)
Admission: EM | Admit: 2020-01-28 | Discharge: 2020-01-28 | Disposition: A | Discharge status: Home / Self Care | Payer: Self-pay | Attending: Family Medicine | Admitting: Family Medicine

## 2020-01-28 ENCOUNTER — Encounter (HOSPITAL_COMMUNITY): Payer: Self-pay

## 2020-01-28 ENCOUNTER — Other Ambulatory Visit: Payer: Self-pay

## 2020-01-28 ENCOUNTER — Telehealth: Payer: Self-pay | Admitting: Family Medicine

## 2020-01-28 DIAGNOSIS — R059 Cough, unspecified: Secondary | ICD-10-CM

## 2020-01-28 DIAGNOSIS — U071 COVID-19: Secondary | ICD-10-CM

## 2020-01-28 MED ORDER — BENZONATATE 200 MG PO CAPS: 200.0000 mg | ORAL_CAPSULE | Freq: Three times a day (TID) | ORAL | 0 refills | Status: DC | PRN

## 2020-01-28 MED ORDER — PROMETHAZINE-DM 6.25-15 MG/5ML PO SYRP: 5.0000 mL | ORAL_SOLUTION | Freq: Four times a day (QID) | ORAL | 0 refills | Status: DC | PRN

## 2020-01-28 NOTE — ED Provider Notes (Signed)
[facility]    CSN: [id number] Arrival date & time: 01/28/20  1611      History   Chief Complaint Chief Complaint  Patient presents with  . Cough  . covid positive    HPI Rita Lynch is a 34 y.o. female.   Here today with 5 day hx of productive cough, body aches, fatigue. Tested positive for COVID 3 days ago. Cough is main symptom bothering her at this time. No current fever, chills, CP, SOB, N/V/D. Cough is worst in AM. Was given an albuterol inhaler several days ago but didn't start using it because she wasn't sure if she should. No known hx of asthma or other pulmonary dz.      Past Medical History:  Diagnosis Date  . Pregnant 2014  . Umbilical hernia     Patient Active Problem List   Diagnosis Date Noted  . Active labor at term 07/01/2013    History reviewed. No pertinent surgical history.  OB History    Gravida  4   Para  4   Term  4   Preterm      AB      Living  4     SAB      TAB      Ectopic      Multiple      Live Births  3            Home Medications    Prior to Admission medications   Medication Sig Start Date End Date Taking? Authorizing Provider  omeprazole (PRILOSEC) 20 MG capsule Take 1 capsule (20 mg total) by mouth daily. 12/24/19  Yes Terrilee Files, MD  acetaminophen (TYLENOL) 325 MG tablet Take 650 mg by mouth every 6 (six) hours as needed for mild pain.     [provider]  benzonatate (TESSALON) 200 MG capsule Take 1 capsule (200 mg total) by mouth 3 (three) times daily as needed for cough. 01/28/20   Particia Nearing, PA-C  levonorgestrel (MIRENA) 20 MCG/24HR IUD 1 each by Intrauterine route once. Patient not taking: Reported on 12/29/2019    [provider]  promethazine-dextromethorphan (PROMETHAZINE-DM) 6.25-15 MG/5ML syrup Take 5 mLs by mouth 4 (four) times daily as needed for cough. 01/28/20   Particia Nearing, PA-C  senna (SENOKOT) 8.6 MG TABS tablet Take 1  tablet (8.6 mg total) by mouth daily. 10/16/18   Grayce Sessions, NP    Family History Family History  Problem Relation Age of Onset  . Healthy Mother   . Healthy Father     Social History Social History   Tobacco Use  . Smoking status: Never Smoker  . Smokeless tobacco: Never Used  Vaping Use  . Vaping Use: Never used  Substance Use Topics  . Alcohol use: Yes    Comment: occ  . Drug use: Never     Allergies   Patient has no known allergies.   Review of Systems Review of Systems PER HPI    Physical Exam Triage Vital Signs ED Triage Vitals  Enc Vitals Group     BP 01/28/20 1754 112/65     Pulse Rate 01/28/20 1754 80     Resp 01/28/20 1754 20     Temp 01/28/20 1754 99.3 F (37.4 C)     Temp Source 01/28/20 1754 Oral     SpO2 01/28/20 1754 100 %     Weight --      Height --  Head Circumference --      Peak Flow --      Pain Score 01/28/20 1750 0     Pain Loc --      Pain Edu? --      Excl. in GC? --    No data found.  Updated Vital Signs BP 112/65 (BP Location: Right Arm)   Pulse 80   Temp 99.3 F (37.4 C) (Oral)   Resp 20   LMP 01/17/2020   SpO2 100%   Visual Acuity Right Eye Distance:   Left Eye Distance:   Bilateral Distance:    Right Eye Near:   Left Eye Near:    Bilateral Near:     Physical Exam Vitals and nursing note reviewed.  Constitutional:      Appearance: Normal appearance. She is not ill-appearing.  HENT:     Head: Atraumatic.     Right Ear: Tympanic membrane normal.     Left Ear: Tympanic membrane normal.     Nose: Rhinorrhea present.     Mouth/Throat:     Mouth: Mucous membranes are moist.     Pharynx: Posterior oropharyngeal erythema present.  Eyes:     Extraocular Movements: Extraocular movements intact.     Conjunctiva/sclera: Conjunctivae normal.  Cardiovascular:     Rate and Rhythm: Normal rate and regular rhythm.     Heart sounds: Normal heart sounds.  Pulmonary:     Effort: Pulmonary effort is  normal.     Breath sounds: Normal breath sounds. No wheezing or rales.  Abdominal:     General: Bowel sounds are normal. There is no distension.     Palpations: Abdomen is soft.     Tenderness: There is no abdominal tenderness. There is no guarding.  Musculoskeletal:        General: Normal range of motion.     Cervical back: Normal range of motion and neck supple.  Skin:    General: Skin is warm and dry.  Neurological:     Mental Status: She is alert and oriented to person, place, and time.  Psychiatric:        Mood and Affect: Mood normal.        Thought Content: Thought content normal.        Judgment: Judgment normal.      UC Treatments / Results  Labs (all labs ordered are listed, but only abnormal results are displayed) Labs Reviewed - No data to display  EKG   Radiology No results found.  Procedures Procedures (including critical care time)  Medications Ordered in UC Medications - No data to display  Initial Impression / Assessment and Plan / UC Course  I have reviewed the triage vital signs and the nursing notes.  Pertinent labs & imaging results that were available during my care of the patient were reviewed by me and considered in my medical decision making (see chart for details).     COVID 19 with lingering cough Vitals, exam reassuring today. She is overall well appearing in no distress. WIll treat with phenergan DM, tessalon perles, and encouraged use of her albuterol inhaler she endorses having at home. Mucinex may also be beneficial for her. Strict ED precautions given for worsening sxs and discussed continued quarantine.    Final Clinical Impressions(s) / UC Diagnoses   Final diagnoses:  COVID-19  Cough   Discharge Instructions   None    ED Prescriptions    Medication Sig Dispense Auth. Provider   promethazine-dextromethorphan (PROMETHAZINE-DM) 6.25-15  MG/5ML syrup Take 5 mLs by mouth 4 (four) times daily as needed for cough. 100 mL Particia Nearing, PA-C   benzonatate (TESSALON) 200 MG capsule Take 1 capsule (200 mg total) by mouth 3 (three) times daily as needed for cough. 20 capsule Particia Nearing, [place]     PDMP not reviewed this encounter.   Particia Nearing, [place] 01/28/20 1815

## 2020-01-28 NOTE — Telephone Encounter (Signed)
Copied from CRM [id number]. Topic: Quick Communication - See Telephone Encounter >> Jan 27, 2020  2:39 PM Aretta Nip wrote: CRM for notification. See Telephone encounter for: 01/27/20. Pt has been tested positive for covid there are no providers that seem to have a virtual appt. Would someone please call pt. For FU @ [phone] >> Jan 28, 2020  9:19 AM Leafy Ro wrote: Pt is calling checking on the status of previous message

## 2020-01-28 NOTE — ED Triage Notes (Signed)
Pt c/o productive cough, onset Friday and tested positive for COVID on Sunday.    Pt denies fever, n/v/d, SOB, abdominal pain, n/v/d, sore throat, congestion, runny nose.  Has been drinking teas with honey and coffees for cough.

## 2020-01-29 ENCOUNTER — Ambulatory Visit: Payer: Self-pay | Attending: Family Medicine | Admitting: Internal Medicine

## 2020-01-29 ENCOUNTER — Encounter: Payer: Self-pay | Admitting: Internal Medicine

## 2020-01-29 DIAGNOSIS — U071 COVID-19: Secondary | ICD-10-CM | POA: Insufficient documentation

## 2020-01-29 NOTE — Progress Notes (Signed)
Really anxious at times  She initially wanted something for cough (covid related). The cough is better after receiving a medicaiton from [facility]  She has a different complaint- she has ongoing feelings of anxiety. They are usually triggered by life events. For example, yesterday husband went to hospital and she became quite anxious. Husband is doing well today but she still is anxious at times.    Discussed anxiety with patient. I think she has situational anxiety and she doesn't think it is severe enough to take a medicaiton or see a Veterinary surgeon. I have advised her to call back if she wants to pursue either of these modalities.

## 2020-01-30 ENCOUNTER — Ambulatory Visit: Payer: Self-pay | Admitting: *Deleted

## 2020-01-30 ENCOUNTER — Other Ambulatory Visit: Payer: Self-pay | Admitting: Family

## 2020-01-30 ENCOUNTER — Emergency Department (HOSPITAL_COMMUNITY)
Admission: EM | Admit: 2020-01-30 | Discharge: 2020-01-30 | Disposition: A | Discharge status: Home / Self Care | Payer: HRSA Program | Attending: Emergency Medicine | Admitting: Emergency Medicine

## 2020-01-30 ENCOUNTER — Other Ambulatory Visit: Payer: Self-pay

## 2020-01-30 ENCOUNTER — Encounter (HOSPITAL_COMMUNITY): Payer: Self-pay

## 2020-01-30 ENCOUNTER — Ambulatory Visit (HOSPITAL_COMMUNITY)
Admission: RE | Admit: 2020-01-30 | Discharge: 2020-01-30 | Disposition: A | Discharge status: Home / Self Care | Payer: HRSA Program | Source: Ambulatory Visit | Attending: Pulmonary Disease | Admitting: Pulmonary Disease

## 2020-01-30 DIAGNOSIS — U071 COVID-19: Secondary | ICD-10-CM | POA: Insufficient documentation

## 2020-01-30 DIAGNOSIS — F419 Anxiety disorder, unspecified: Secondary | ICD-10-CM | POA: Insufficient documentation

## 2020-01-30 DIAGNOSIS — Z609 Problem related to social environment, unspecified: Secondary | ICD-10-CM | POA: Insufficient documentation

## 2020-01-30 DIAGNOSIS — R059 Cough, unspecified: Secondary | ICD-10-CM | POA: Diagnosis present

## 2020-01-30 HISTORY — DX: COVID-19: U07.1

## 2020-01-30 HISTORY — DX: Anxiety disorder, unspecified: F41.9

## 2020-01-30 MED ORDER — SODIUM CHLORIDE 0.9 % IV SOLN: INTRAVENOUS | Status: DC | PRN

## 2020-01-30 MED ORDER — SODIUM CHLORIDE 0.9 % IV SOLN: Freq: Once | INTRAVENOUS | Status: AC

## 2020-01-30 MED ORDER — DIPHENHYDRAMINE HCL 50 MG/ML IJ SOLN: 50.0000 mg | Freq: Once | INTRAMUSCULAR | Status: DC | PRN

## 2020-01-30 MED ORDER — FAMOTIDINE IN NACL 20-0.9 MG/50ML-% IV SOLN: 20.0000 mg | Freq: Once | INTRAVENOUS | Status: DC | PRN

## 2020-01-30 MED ORDER — EPINEPHRINE 0.3 MG/0.3ML IJ SOAJ: 0.3000 mg | Freq: Once | INTRAMUSCULAR | Status: DC | PRN

## 2020-01-30 MED ORDER — METHYLPREDNISOLONE SODIUM SUCC 125 MG IJ SOLR: 125.0000 mg | Freq: Once | INTRAMUSCULAR | Status: DC | PRN

## 2020-01-30 MED ORDER — ALBUTEROL SULFATE HFA 108 (90 BASE) MCG/ACT IN AERS: 2.0000 | INHALATION_SPRAY | Freq: Once | RESPIRATORY_TRACT | Status: DC | PRN

## 2020-01-30 NOTE — Discharge Instructions (Signed)

## 2020-01-30 NOTE — Progress Notes (Signed)
I connected by phone with Rita Lynch on 01/30/2020 at 12:35 PM to discuss the potential use of a new treatment for mild to moderate COVID-19 viral infection in non-hospitalized patients.  This patient is a 34 y.o. female that meets the FDA criteria for Emergency Use Authorization of COVID monoclonal antibody casirivimab/imdevimab or bamlanivimab/eteseviamb.  Has a (+) direct SARS-CoV-2 viral test result  Has mild or moderate COVID-19   Is NOT hospitalized due to COVID-19  Is within 10 days of symptom onset  Has at least one of the high risk factor(s) for progression to severe COVID-19 and/or hospitalization as defined in EUA.  Specific high risk criteria : Other high risk medical condition per CDC:  high risk social vulnerability score   I have spoken and communicated the following to the patient or parent/caregiver regarding COVID monoclonal antibody treatment:  1. FDA has authorized the emergency use for the treatment of mild to moderate COVID-19 in adults and pediatric patients with positive results of direct SARS-CoV-2 viral testing who are 33 years of age and older weighing at least 40 kg, and who are at high risk for progressing to severe COVID-19 and/or hospitalization.  2. The significant known and potential risks and benefits of COVID monoclonal antibody, and the extent to which such potential risks and benefits are unknown.  3. Information on available alternative treatments and the risks and benefits of those alternatives, including clinical trials.  4. Patients treated with COVID monoclonal antibody should continue to self-isolate and use infection control measures (e.g., wear mask, isolate, social distance, avoid sharing personal items, clean and disinfect "high touch" surfaces, and frequent handwashing) according to CDC guidelines.   5. The patient or parent/caregiver has the option to accept or refuse COVID monoclonal antibody treatment.  After reviewing this  information with the patient, the patient has agreed to receive one of the available covid 19 monoclonal antibodies and will be provided an appropriate fact sheet prior to infusion.   [name], FNP 01/30/2020 12:35 PM

## 2020-01-30 NOTE — Telephone Encounter (Signed)
Summary: COVID    Patient was dx with COVID 01/26/2020 patient experiencing coughing which is not improving and little not taste. Patient inquiring about antibody infusion and "ivermectin" please advise     Patient is calling to make appointment- she has COVID and has questions about treatment.Infusion information given. There are no appointments in the office- advised virtual visit on line or UC next best options. Will send note to office for review.   Reason for Disposition . [1] COVID-19 diagnosed by positive lab test AND [2] mild symptoms (e.g., cough, fever, others) AND [3] no complications or SOB  Answer Assessment - Initial Assessment Questions 1. COVID-19 DIAGNOSIS: "Who made your Coronavirus (COVID-19) diagnosis?" "Was it confirmed by a positive lab test?" If not diagnosed by a HCP, ask "Are there lots of cases (community spread) where you live?" (See public health department website, if unsure)     [facility]- home exposure with husband 2. COVID-19 EXPOSURE: "Was there any known exposure to COVID before the symptoms began?" CDC Definition of close contact: within 6 feet (2 meters) for a total of 15 minutes or more over a 24-hour period.      Husband COVID + 3. ONSET: "When did the COVID-19 symptoms start?"      Symptoms started last Friday 4. WORST SYMPTOM: "What is your worst symptom?" (e.g., cough, fever, shortness of breath, muscle aches)     Cough- was treated at [facility] for cough 5. COUGH: "Do you have a cough?" If Yes, ask: "How bad is the cough?"       Productive cough- patient is able to sleep at night 6. FEVER: "Do you have a fever?" If Yes, ask: "What is your temperature, how was it measured, and when did it start?"     No fever 7. RESPIRATORY STATUS: "Describe your breathing?" (e.g., shortness of breath, wheezing, unable to speak)      Normal- SOB with anxiety- with exertion at times 8. BETTER-SAME-WORSE: "Are you getting better, staying the same or getting worse compared  to yesterday?"  If getting worse, ask, "In what way?"     same 9. HIGH RISK DISEASE: "Do you have any chronic medical problems?" (e.g., asthma, heart or lung disease, weak immune system, obesity, etc.)     no 10. PREGNANCY: "Is there any chance you are pregnant?" "When was your last menstrual period?"       No- LMP- 01/17/20 11. OTHER SYMPTOMS: "Do you have any other symptoms?"  (e.g., chills, fatigue, headache, loss of smell or taste, muscle pain, sore throat; new loss of smell or taste especially support the diagnosis of COVID-19)       no  Protocols used: CORONAVIRUS (COVID-19) DIAGNOSED OR SUSPECTED-A-AH

## 2020-01-30 NOTE — Progress Notes (Signed)
[name] began having symptoms on 10/8 and continues to have cough and fevers. She was seen in the ED today with good oxygen saturations. Qualifying risk factors include high social risk vulnerability.  I spoke with [name] regarding the risks and benefits of treatment with monoclonal antibodies and she wishes to proceed with treatment.   Marcos Eke, NP 01/30/2020 12:46 PM

## 2020-01-30 NOTE — ED Provider Notes (Addendum)
[facility]-EMERGENCY DEPT Provider Note   CSN: [id number] Arrival date & time: 01/30/20  1025     History Chief Complaint  Patient presents with  . Covid Positive  . Cough    Rita Lynch is a 34 y.o. female with known COVID-19 infection who presents for evaluation of candidacy for monoclonal antibody therapy.  She states that she started with symptoms on Friday, October 8, with cough, loss of taste.  She tested positive for COVID-19 on Monday, October 11.  She was seen in urgent care on Wednesday the 13th, where she was prescribed Tessalon, promethazine, albuterol inhaler.  She states she has been taking these medications and is feeling much better today.  She states persistent cough with production of yellow mucus, however she denies chest pain, shortness of breath, palpitations, abdominal pain, nausea, vomiting, diarrhea, congestion, sore throat, loss of smell. Patient was not vaccinated against COVID-19.  She presents today on day 8 of symptoms.  She is feeling anxious, as her husband is admitted to the hospital here at [facility] for hypoxia in context of COVID-19 infection, and she has not seen her children since she tested positive on Monday.  She reports more frequent anxiety attacks since her husband has been admitted to the hospital. Her husband's primary care doctor recommended she seek evaluation for consideration of monoclonal antibiotic therapy.  I personally reviewed this patient medical records.  She is an otherwise healthy 34 year old female, she denies any medications every day, does not have any known medical diagnoses.  She does suffer from anxiety.  HPI     Past Medical History:  Diagnosis Date  . Anxiety   . COVID   . Pregnant 2014  . Umbilical hernia     Patient Active Problem List   Diagnosis Date Noted  . COVID 01/29/2020  . Active labor at term 07/01/2013    History reviewed. No pertinent surgical history.   OB History     Gravida  4   Para  4   Term  4   Preterm      AB      Living  4     SAB      TAB      Ectopic      Multiple      Live Births  3           Family History  Problem Relation Age of Onset  . Healthy Mother   . Healthy Father     Social History   Tobacco Use  . Smoking status: Never Smoker  . Smokeless tobacco: Never Used  Vaping Use  . Vaping Use: Never used  Substance Use Topics  . Alcohol use: Yes    Comment: occ  . Drug use: Never    Home Medications Prior to Admission medications   Medication Sig Start Date End Date Taking? Authorizing Provider  acetaminophen (TYLENOL) 325 MG tablet Take 650 mg by mouth every 6 (six) hours as needed for mild pain.     [provider]  benzonatate (TESSALON) 200 MG capsule Take 1 capsule (200 mg total) by mouth 3 (three) times daily as needed for cough. 01/28/20   Particia Nearing, PA-C  omeprazole (PRILOSEC) 20 MG capsule Take 1 capsule (20 mg total) by mouth daily. 12/24/19   Terrilee Files, MD  promethazine-dextromethorphan (PROMETHAZINE-DM) 6.25-15 MG/5ML syrup Take 5 mLs by mouth 4 (four) times daily as needed for cough. 01/28/20   Particia Nearing,  PA-C    Allergies    Patient has no known allergies.  Review of Systems   Review of Systems  Constitutional: Negative for activity change, chills, fatigue and fever.  HENT: Negative for congestion, ear pain, rhinorrhea, sore throat and trouble swallowing.   Eyes: Negative.   Respiratory: Positive for cough. Negative for chest tightness and shortness of breath.   Cardiovascular: Negative for chest pain, palpitations and leg swelling.  Gastrointestinal: Negative for abdominal pain, diarrhea, nausea and vomiting.  Endocrine: Negative.   Genitourinary: Negative for dysuria.  Musculoskeletal: Negative.   Skin: Negative.   Neurological: Negative for dizziness, weakness, light-headedness and headaches.  Hematological: Negative.     Psychiatric/Behavioral: The patient is nervous/anxious.     Physical Exam Updated Vital Signs BP 119/89 (BP Location: Right Arm)   Pulse 91   Temp 99 F (37.2 C) (Oral)   Resp 18   Ht 5\' 1"  (1.549 m)   Wt 57.2 kg   LMP 01/17/2020   SpO2 98%   BMI 23.81 kg/m   Physical Exam Vitals and nursing note reviewed.  Constitutional:      Appearance: She is not ill-appearing.  HENT:     Head: Normocephalic and atraumatic.     Mouth/Throat:     Mouth: Mucous membranes are moist.     Pharynx: No oropharyngeal exudate or posterior oropharyngeal erythema.  Eyes:     General:        Right eye: No discharge.        Left eye: No discharge.     Conjunctiva/sclera: Conjunctivae normal.     Pupils: Pupils are equal, round, and reactive to light.  Cardiovascular:     Rate and Rhythm: Normal rate and regular rhythm.     Pulses: Normal pulses.     Heart sounds: Normal heart sounds. No murmur heard.   Pulmonary:     Effort: Pulmonary effort is normal. No respiratory distress.     Breath sounds: Normal breath sounds. No wheezing or rales.  Abdominal:     General: There is no distension.     Palpations: Abdomen is soft.     Tenderness: There is no abdominal tenderness. There is no rebound.  Musculoskeletal:        General: No deformity.     Cervical back: Neck supple.     Right lower leg: No edema.     Left lower leg: No edema.  Lymphadenopathy:     Cervical: No cervical adenopathy.  Skin:    General: Skin is warm and dry.     Capillary Refill: Capillary refill takes less than 2 seconds.  Neurological:     General: No focal deficit present.     Mental Status: She is alert and oriented to person, place, and time. Mental status is at baseline.  Psychiatric:        Mood and Affect: Mood normal.     ED Results / Procedures / Treatments   Labs (all labs ordered are listed, but only abnormal results are displayed) Labs Reviewed - No data to display  EKG None  Radiology No  results found.  Procedures Procedures (including critical care time)  Medications Ordered in ED Medications - No data to display  ED Course  I have reviewed the triage vital signs and the nursing notes.  Pertinent labs & imaging results that were available during my care of the patient were reviewed by me and considered in my medical decision making (see chart for details).  MDM Rules/Calculators/A&P                         Patient presents for evaluation candidacy for monoclonal antibody therapy, in context of known COVID-19 infection.  She symptomatic only with productive cough, much improved after prescription of Tessalon and promethazine, and albuterol inhaler Wednesday by urgent care provider.   Her vital signs are normal on intake, oxygen saturation of 98% on room air, normotensive, heart rate of 91, afebrile.   I reviewed this patient's medical record, as well as monoclonal antibody candidate criteria. The patient's physical exam is extremely reassuring, and she does not meet criteria for monoclonal antibody infusion. She is under the age of 61, she has a normal body weight, she is not pregnant, denies history of chronic kidney disease, diabetes, immunosuppressive disease or treatment, cardiovascular disease, chronic lung disease.  I discussed criteria for treatment with MAB with this patient, as well as the fact that she is not a candidate.  I also encouraged her that her physical exam is extremely reassuring.   She already has follow-up appointment scheduled with her primary care doctor for next week, 10 days after onset of her symptoms.  I encouraged her to keep this appointment.  She voiced understanding of my evaluation and our discussion today.  Each of her questions were answered to her expressed satisfaction.  Strict return precautions were given.  Patient is stable for discharge at this time.   ADDENDUM: Immediately prior to my discharging of this patient, I was contacted  by [name], FNP who works with the monoclonal antibody infusion center. He states that he was given this patient's name, and he has determined that based on her ethnicity she is at increased social vulnerability risk for complications from COVID-19 infection, and she is therefore a good candidate to receive monoclonal antibody therapy.  This provider then ensured me that they would be happy to receive her in the infusion center immediately following her discharge from the emergency department,  to gain consent and administer the monoclonal antibody infusion.  I informed the patient about this change in her treatment plan.  She voiced understanding and each of her questions were answered to her expressed satisfaction.  Have communicated personally with the nurse, who was agreeable to walking her over to the infusion center.  I appreciate input of [name], FNP who graciously reached out to me to advocate for this patient to receive this treatment.  Final Clinical Impression(s) / ED Diagnoses Final diagnoses:  None    Rx / DC Orders ED Discharge Orders    None       Paris Lore, PA-C 01/30/20 1141    Tarrence Enck, Eugene Gavia, PA-C 01/30/20 1206    Lorre Nick, MD 02/05/20 1139

## 2020-01-30 NOTE — ED Triage Notes (Addendum)
Patient states she continues to have a productive cough with yellow sputum. Patient states she came today because her husband's doctor told them about the "Covid infusion" that she may could get.

## 2020-01-30 NOTE — Telephone Encounter (Signed)
Patient has an appointment scheduled on 02/23/20

## 2020-01-30 NOTE — Progress Notes (Signed)
  Diagnosis: COVID-19  Physician: Dr. Wright  Procedure: Covid Infusion Clinic Med: bamlanivimab\etesevimab infusion - Provided patient with bamlanimivab\etesevimab fact sheet for patients, parents and caregivers prior to infusion.  Complications: No immediate complications noted.  Discharge: Discharged home   Latoshia Monrroy R Wilber Fini 01/30/2020   

## 2020-01-30 NOTE — Discharge Instructions (Addendum)
You were evaluated today for consideration of monoclonal antibody therapy for treatment of your COVID-19 infection.  You are a candidate for this treatment.  I have spoken with the coordinator and he is agreeable to infusion immediately after your discharge from the emergency department.   Additionally, your physical exam was very reassuring; your heart and your lungs sound good!  Please follow-up with your primary care doctor next week, as you to have already planned to do.  You may continue to utilize the medications prescribed to you by the provider at urgent care on Wednesday.  Please return to the emergency department immediately if you develop any worsening shortness of breath, chest pain, abdominal pain or vomiting that will not stop, loss of consciousness, or any new severe symptoms.

## 2020-02-04 ENCOUNTER — Ambulatory Visit (INDEPENDENT_AMBULATORY_CARE_PROVIDER_SITE_OTHER): Payer: Self-pay

## 2020-02-04 ENCOUNTER — Ambulatory Visit (HOSPITAL_COMMUNITY)
Admission: EM | Admit: 2020-02-04 | Discharge: 2020-02-04 | Disposition: A | Discharge status: Home / Self Care | Payer: Self-pay | Attending: Family Medicine | Admitting: Family Medicine

## 2020-02-04 ENCOUNTER — Other Ambulatory Visit: Payer: Self-pay

## 2020-02-04 ENCOUNTER — Encounter (HOSPITAL_COMMUNITY): Payer: Self-pay | Admitting: Emergency Medicine

## 2020-02-04 DIAGNOSIS — R058 Other specified cough: Secondary | ICD-10-CM

## 2020-02-04 DIAGNOSIS — R0602 Shortness of breath: Secondary | ICD-10-CM

## 2020-02-04 DIAGNOSIS — R059 Cough, unspecified: Secondary | ICD-10-CM

## 2020-02-04 DIAGNOSIS — Z8616 Personal history of COVID-19: Secondary | ICD-10-CM

## 2020-02-04 MED ORDER — PREDNISONE 20 MG PO TABS: 40.0000 mg | ORAL_TABLET | Freq: Every day | ORAL | 0 refills | Status: AC

## 2020-02-04 MED ORDER — DM-GUAIFENESIN ER 30-600 MG PO TB12: 1.0000 | ORAL_TABLET | Freq: Two times a day (BID) | ORAL | 0 refills | Status: DC

## 2020-02-04 NOTE — Discharge Instructions (Signed)
Chest x-ray normal Follow-up with primary care if symptoms persisting Continue Tessalon/benzonatate every 8 hours as needed for cough Prednisone 40 mg daily for 5 days Mucinex DM twice daily for congestion/cough Rest and fluids Return if not improving or worsening

## 2020-02-04 NOTE — ED Triage Notes (Signed)
Patient c/o having a having a cough x10 days.   Patient states that she tested positive for COVID 01/26/20.  Per patient, cough "getting better, but concerned about cough". Cough "gets worse when I'm cleaning or moving stuff".   Coughing up "yellow" mucus.  Patient wanted to follow-up for symptoms.

## 2020-02-04 NOTE — ED Provider Notes (Signed)
[facility]    CSN: [id number] Arrival date & time: 02/04/20  1048      History   Chief Complaint No chief complaint on file. COUGH  HPI Rita Lynch is a 34 y.o. female history of anxiety, recent Covid presenting today for evaluation of a cough.  Patient tested positive for Covid on 01/26/2020.  She reports that overall her symptoms are improving, but concerned as she has had a persistent cough.  Reports cough worsens with exertion.  She also reports an associated chest pressure.  Coughing up mucus.  Reports that she was having some issues with shortness of breath prior to Covid infection and was given albuterol inhaler previously, plans to follow-up with primary care on 11/8 for further evaluation of breathing.  Denies prior DVT/PE.  Denies leg pain or leg swelling.  Denies tobacco use.  Denies recent travel/immobilization. Denies estrogen.    HPI  Past Medical History:  Diagnosis Date  . Anxiety   . COVID   . Pregnant 2014  . Umbilical hernia     Patient Active Problem List   Diagnosis Date Noted  . COVID 01/29/2020  . Active labor at term 07/01/2013    History reviewed. No pertinent surgical history.  OB History    Gravida  4   Para  4   Term  4   Preterm      AB      Living  4     SAB      TAB      Ectopic      Multiple      Live Births  3            Home Medications    Prior to Admission medications   Medication Sig Start Date End Date Taking? Authorizing Provider  benzonatate (TESSALON) 200 MG capsule Take 1 capsule (200 mg total) by mouth 3 (three) times daily as needed for cough. 01/28/20  Yes Particia Nearing, PA-C  promethazine-dextromethorphan (PROMETHAZINE-DM) 6.25-15 MG/5ML syrup Take 5 mLs by mouth 4 (four) times daily as needed for cough. 01/28/20  Yes Particia Nearing, PA-C  acetaminophen (TYLENOL) 325 MG tablet Take 650 mg by mouth every 6 (six) hours as needed for mild pain.     [provider]  dextromethorphan-guaiFENesin (MUCINEX DM) 30-600 MG 12hr tablet Take 1 tablet by mouth 2 (two) times daily. 02/04/20   Fardeen Steinberger C, PA-C  omeprazole (PRILOSEC) 20 MG capsule Take 1 capsule (20 mg total) by mouth daily. 12/24/19   Terrilee Files, MD  predniSONE (DELTASONE) 20 MG tablet Take 2 tablets (40 mg total) by mouth daily with breakfast for 5 days. 02/04/20 02/09/20  Shinita Mac, Junius Creamer, PA-C    Family History Family History  Problem Relation Age of Onset  . Healthy Mother   . Healthy Father     Social History Social History   Tobacco Use  . Smoking status: Never Smoker  . Smokeless tobacco: Never Used  Vaping Use  . Vaping Use: Never used  Substance Use Topics  . Alcohol use: Yes    Comment: occ  . Drug use: Never     Allergies   Patient has no known allergies.   Review of Systems Review of Systems  Constitutional: Negative for activity change, appetite change, chills, fatigue and fever.  HENT: Negative for congestion, ear pain, rhinorrhea, sinus pressure, sore throat and trouble swallowing.   Eyes: Negative for discharge and redness.  Respiratory: Positive for  cough and chest tightness. Negative for shortness of breath.   Cardiovascular: Negative for chest pain.  Gastrointestinal: Negative for abdominal pain, diarrhea, nausea and vomiting.  Musculoskeletal: Negative for myalgias.  Skin: Negative for rash.  Neurological: Negative for dizziness, light-headedness and headaches.     Physical Exam Triage Vital Signs ED Triage Vitals  Enc Vitals Group     BP 02/04/20 1209 102/66     Pulse Rate 02/04/20 1209 82     Resp 02/04/20 1209 18     Temp 02/04/20 1209 98.7 F (37.1 C)     Temp Source 02/04/20 1209 Oral     SpO2 02/04/20 1209 100 %     Weight 02/04/20 1208 126 lb (57.2 kg)     Height 02/04/20 1208 5\' 1"  (1.549 m)     Head Circumference --      Peak Flow --      Pain Score 02/04/20 1207 0     Pain Loc --      Pain Edu? --       Excl. in GC? --    No data found.  Updated Vital Signs BP 102/66 (BP Location: Right Arm)   Pulse 82   Temp 98.7 F (37.1 C) (Oral)   Resp 18   Ht 5\' 1"  (1.549 m)   Wt 126 lb (57.2 kg)   LMP 01/17/2020   SpO2 100%   BMI 23.81 kg/m   Visual Acuity Right Eye Distance:   Left Eye Distance:   Bilateral Distance:    Right Eye Near:   Left Eye Near:    Bilateral Near:     Physical Exam Vitals and nursing note reviewed.  Constitutional:      Appearance: She is well-developed.     Comments: No acute distress  HENT:     Head: Normocephalic and atraumatic.     Ears:     Comments: Bilateral ears without tenderness to palpation of external auricle, tragus and mastoid, EAC's without erythema or swelling, TM's with good bony landmarks and cone of light. Non erythematous.    Nose: Nose normal.     Mouth/Throat:     Comments: Oral mucosa pink and moist, no tonsillar enlargement or exudate. Posterior pharynx patent and nonerythematous, no uvula deviation or swelling. Normal phonation.  Eyes:     Conjunctiva/sclera: Conjunctivae normal.  Cardiovascular:     Rate and Rhythm: Normal rate and regular rhythm.  Pulmonary:     Effort: Pulmonary effort is normal. No respiratory distress.     Comments: Breathing comfortably at rest, CTABL, no wheezing, rales or other adventitious sounds auscultated  Occasional cough in room Abdominal:     General: There is no distension.  Musculoskeletal:        General: Normal range of motion.     Cervical back: Neck supple.     Comments: Bilateral lower legs symmetric without calf tenderness or swelling  Skin:    General: Skin is warm and dry.  Neurological:     Mental Status: She is alert and oriented to person, place, and time.      UC Treatments / Results  Labs (all labs ordered are listed, but only abnormal results are displayed) Labs Reviewed - No data to display  EKG   Radiology DG Chest 2 View  Result Date:  02/04/2020 CLINICAL DATA:  Recent COVID-19 infection with continued cough, shortness of breath on exertion and pressure. EXAM: CHEST - 2 VIEW COMPARISON:  12/24/2019 and 06/21/2019. FINDINGS: The heart  size and mediastinal contours are normal. The lungs are clear. There is no pleural effusion or pneumothorax. No acute osseous findings are identified. IMPRESSION: Stable chest.  No active cardiopulmonary process. Electronically Signed   By: Carey Bullocks M.D.   On: 02/04/2020 13:04    Procedures Procedures (including critical care time)  Medications Ordered in UC Medications - No data to display  Initial Impression / Assessment and Plan / UC Course  I have reviewed the triage vital signs and the nursing notes.  Pertinent labs & imaging results that were available during my care of the patient were reviewed by me and considered in my medical decision making (see chart for details).     Chest x-ray normal, no acute abnormality, most likely post viral cough, providing course of prednisone and continue symptomatic and supportive care of cough and congestion.  Low suspicion of DVT at this time, but continue to monitor breathing and symptoms.  Discussed strict return precautions. Patient verbalized understanding and is agreeable with plan.  Final Clinical Impressions(s) / UC Diagnoses   Final diagnoses:  Post-viral cough syndrome     Discharge Instructions     Chest x-ray normal Follow-up with primary care if symptoms persisting Continue Tessalon/benzonatate every 8 hours as needed for cough Prednisone 40 mg daily for 5 days Mucinex DM twice daily for congestion/cough Rest and fluids Return if not improving or worsening    ED Prescriptions    Medication Sig Dispense Auth. Provider   predniSONE (DELTASONE) 20 MG tablet Take 2 tablets (40 mg total) by mouth daily with breakfast for 5 days. 10 tablet Dhaval Woo C, PA-C   dextromethorphan-guaiFENesin (MUCINEX DM) 30-600 MG 12hr  tablet Take 1 tablet by mouth 2 (two) times daily. 20 tablet Tyde Lamison, [place] C, PA-C     PDMP not reviewed this encounter.   Sharyon Cable Clay C, PA-C 02/04/20 1326

## 2020-02-05 ENCOUNTER — Ambulatory Visit: Payer: Self-pay | Admitting: Gastroenterology

## 2020-02-17 ENCOUNTER — Telehealth: Payer: Self-pay | Admitting: Family Medicine

## 2020-02-23 ENCOUNTER — Ambulatory Visit: Payer: Self-pay | Attending: Family Medicine | Admitting: Family Medicine

## 2020-02-23 ENCOUNTER — Other Ambulatory Visit: Payer: Self-pay

## 2020-02-23 ENCOUNTER — Encounter: Payer: Self-pay | Admitting: Family Medicine

## 2020-02-23 VITALS — BP 109/58 | HR 80 | Temp 98.8°F | Ht 61.0 in | Wt 125.0 lb

## 2020-02-23 DIAGNOSIS — N644 Mastodynia: Secondary | ICD-10-CM

## 2020-02-23 DIAGNOSIS — J9801 Acute bronchospasm: Secondary | ICD-10-CM

## 2020-02-23 MED ORDER — ALBUTEROL SULFATE HFA 108 (90 BASE) MCG/ACT IN AERS: 2.0000 | INHALATION_SPRAY | Freq: Four times a day (QID) | RESPIRATORY_TRACT | 1 refills | Status: DC | PRN

## 2020-02-23 NOTE — Progress Notes (Signed)
Seen in ED for asthma.  States she feel a knot on top of left breast.

## 2020-02-23 NOTE — Progress Notes (Signed)
Subjective:  Patient ID: Rita Lynch, female    DOB: 02-18-1986  Age: 34 y.o. MRN: [id number]  CC: Asthma   HPI Rita Lynch is a 34 year old female with a history of asthma, history of COVID-19 in 01/2020 status post monoclonal antibody infusion. Seen at the ED on 02/04/2020 for postviral cough syndrome which was treated with prednisone and an antitussive and her symptoms have resolved.   Noticed a left upper breast lump which has been intermittent and painful. Presence of lump is unrelated to monthly periods and she is unsure of change in size. She also complains of ' feeling like she is choking' after exertion and is wondering if this could be asthma.  Her symptoms started before her COVID-19 infection and she has never had history of asthma.  Denies presence of wheezing, chest pains or excessive cough.  Past Medical History:  Diagnosis Date  . Anxiety   . COVID   . Pregnant 2014  . Umbilical hernia     No past surgical history on file.  Family History  Problem Relation Age of Onset  . Healthy Mother   . Healthy Father     No Known Allergies  Outpatient Medications Prior to Visit  Medication Sig Dispense Refill  . albuterol (VENTOLIN HFA) 108 (90 Base) MCG/ACT inhaler Inhale into the lungs every 6 (six) hours as needed for wheezing or shortness of breath.    Marland Kitchen acetaminophen (TYLENOL) 325 MG tablet Take 650 mg by mouth every 6 (six) hours as needed for mild pain.  (Patient not taking: Reported on 02/23/2020)    . benzonatate (TESSALON) 200 MG capsule Take 1 capsule (200 mg total) by mouth 3 (three) times daily as needed for cough. (Patient not taking: Reported on 02/23/2020) 20 capsule 0  . dextromethorphan-guaiFENesin (MUCINEX DM) 30-600 MG 12hr tablet Take 1 tablet by mouth 2 (two) times daily. (Patient not taking: Reported on 02/23/2020) 20 tablet 0  . omeprazole (PRILOSEC) 20 MG capsule Take 1 capsule (20 mg total) by mouth daily. (Patient not taking:  Reported on 02/23/2020) 30 capsule 1  . promethazine-dextromethorphan (PROMETHAZINE-DM) 6.25-15 MG/5ML syrup Take 5 mLs by mouth 4 (four) times daily as needed for cough. (Patient not taking: Reported on 02/23/2020) 100 mL 0   No facility-administered medications prior to visit.     ROS Review of Systems  Constitutional: Negative for activity change, appetite change and fatigue.  HENT: Negative for congestion, sinus pressure and sore throat.   Eyes: Negative for visual disturbance.  Respiratory: Negative for cough, chest tightness, shortness of breath and wheezing.   Cardiovascular: Negative for chest pain and palpitations.  Gastrointestinal: Negative for abdominal distention, abdominal pain and constipation.  Endocrine: Negative for polydipsia.  Genitourinary: Negative for dysuria and frequency.  Musculoskeletal: Negative for arthralgias and back pain.  Skin: Negative for rash.  Neurological: Negative for tremors, light-headedness and numbness.  Hematological: Does not bruise/bleed easily.  Psychiatric/Behavioral: Negative for agitation and behavioral problems.    Objective:  BP (!) 109/58   Pulse 80   Temp 98.8 F (37.1 C) (Oral)   Ht 5\' 1"  (1.549 m)   Wt 125 lb (56.7 kg)   SpO2 99%   BMI 23.62 kg/m   BP/Weight 02/23/2020 02/04/2020 01/30/2020  Systolic BP 109 102 115  Diastolic BP 58 66 74  Wt. (Lbs) 125 126 -  BMI 23.62 23.81 -      Physical Exam Constitutional:      Appearance: She is well-developed.  Neck:  Vascular: No JVD.  Cardiovascular:     Rate and Rhythm: Normal rate.     Heart sounds: Normal heart sounds. No murmur heard.   Pulmonary:     Effort: Pulmonary effort is normal.     Breath sounds: Normal breath sounds. No wheezing or rales.  Chest:     Chest wall: No tenderness.     Breasts:        Right: No mass or tenderness.        Left: Tenderness (costochondral junction of 2nd rib) present. No mass.  Abdominal:     General: Bowel sounds are  normal. There is no distension.     Palpations: Abdomen is soft. There is no mass.     Tenderness: There is no abdominal tenderness.  Musculoskeletal:        General: Normal range of motion.     Right lower leg: No edema.     Left lower leg: No edema.  Lymphadenopathy:     Upper Body:     Right upper body: No supraclavicular, axillary or pectoral adenopathy.     Left upper body: No supraclavicular, axillary or pectoral adenopathy.  Neurological:     Mental Status: She is alert and oriented to person, place, and time.  Psychiatric:        Mood and Affect: Mood normal.     CMP Latest Ref Rng & Units 12/24/2019 05/30/2018 04/05/2018  Glucose 70 - 99 mg/dL 96 83 176(H)  BUN 6 - 20 mg/dL 13 8 11   Creatinine 0.44 - 1.00 mg/dL 6.07 3.71  Sodium 135 - 145 mmol/L 138 139 139  Potassium 3.5 - 5.1 mmol/L 3.5 4.0 3.6  Chloride 98 - 111 mmol/L 103 107 110  CO2 22 - 32 mmol/L 24 26 22   Calcium 8.9 - 10.3 mg/dL 9.2 9.4 9.1    Lipid Panel     Component Value Date/Time   CHOL 173 12/29/2019 1123   TRIG 70 12/29/2019 1123   HDL 66 12/29/2019 1123   CHOLHDL 2.6 12/29/2019 1123   LDLCALC 94 12/29/2019 1123    CBC    Component Value Date/Time   WBC 5.6 12/24/2019 1147   RBC 4.50 12/24/2019 1147   HGB 12.7 12/24/2019 1147   HCT 39.1 12/24/2019 1147   PLT 183 12/24/2019 1147   MCV 86.9 12/24/2019 1147   MCH 28.2 12/24/2019 1147   MCHC 32.5 12/24/2019 1147   RDW 12.9 12/24/2019 1147   LYMPHSABS 1.5 04/05/2018 1719   MONOABS 0.7 04/05/2018 1719   EOSABS 0.1 04/05/2018 1719   BASOSABS 0.0 04/05/2018 1719    No results found for: HGBA1C  Assessment & Plan:  1. Bronchospasm Trial of MDI If symptoms persist consider PFTs - albuterol (VENTOLIN HFA) 108 (90 Base) MCG/ACT inhaler; Inhale 2 puffs into the lungs every 6 (six) hours as needed for wheezing or shortness of breath.  Dispense: 18 g; Refill: 1  2. Breast pain No mass palpated on exam but she might have some underlying  costochondritis Education has been provided regarding diagnosis and treatment of costochondritis I will order breast imaging as well. - MM 3D SCREEN BREAST BILATERAL; Future - 04/07/2018 BREAST LTD UNI LEFT INC AXILLA; Future   Meds ordered this encounter  Medications  . albuterol (VENTOLIN HFA) 108 (90 Base) MCG/ACT inhaler    Sig: Inhale 2 puffs into the lungs every 6 (six) hours as needed for wheezing or shortness of breath.    Dispense:  18 g  Refill:  1    Follow-up: Return if symptoms worsen or fail to improve.       Hoy Register, MD, FAAFP. [facility] [place], [place] [phone]   02/23/2020, 10:16 AM

## 2020-02-23 NOTE — Patient Instructions (Signed)
Costochondritis Costochondritis is swelling and irritation (inflammation) of the tissue (cartilage) that connects your ribs to your breastbone (sternum). This causes pain in the front of your chest. Usually, the pain:  Starts gradually.  Is in more than one rib. This condition usually goes away on its own over time. Follow these instructions at home:  Do not do anything that makes your pain worse.  If directed, put ice on the painful area: ? Put ice in a plastic bag. ? Place a towel between your skin and the bag. ? Leave the ice on for 20 minutes, 2-3 times a day.  If directed, put heat on the affected area as often as told by your doctor. Use the heat source that your doctor tells you to use, such as a moist heat pack or a heating pad. ? Place a towel between your skin and the heat source. ? Leave the heat on for 20-30 minutes. ? Take off the heat if your skin turns bright red. This is very important if you cannot feel pain, heat, or cold. You may have a greater risk of getting burned.  Take over-the-counter and prescription medicines only as told by your doctor.  Return to your normal activities as told by your doctor. Ask your doctor what activities are safe for you.  Keep all follow-up visits as told by your doctor. This is important. Contact a doctor if:  You have chills or a fever.  Your pain does not go away or it gets worse.  You have a cough that does not go away. Get help right away if:  You are short of breath. This information is not intended to replace advice given to you by your health care provider. Make sure you discuss any questions you have with your health care provider. Document Revised: 04/18/2017 Document Reviewed: 07/28/2015 Elsevier Patient Education  2020 Elsevier Inc.  

## 2020-03-02 ENCOUNTER — Encounter: Payer: Self-pay | Admitting: Gastroenterology

## 2020-03-02 ENCOUNTER — Ambulatory Visit: Payer: Self-pay | Admitting: Gastroenterology

## 2020-03-02 VITALS — BP 112/74 | HR 78 | Ht 61.0 in | Wt 126.0 lb

## 2020-03-02 DIAGNOSIS — R0602 Shortness of breath: Secondary | ICD-10-CM

## 2020-03-02 DIAGNOSIS — R1319 Other dysphagia: Secondary | ICD-10-CM

## 2020-03-02 DIAGNOSIS — R12 Heartburn: Secondary | ICD-10-CM

## 2020-03-02 DIAGNOSIS — R1013 Epigastric pain: Secondary | ICD-10-CM

## 2020-03-02 DIAGNOSIS — K219 Gastro-esophageal reflux disease without esophagitis: Secondary | ICD-10-CM

## 2020-03-02 DIAGNOSIS — Z800 Family history of malignant neoplasm of digestive organs: Secondary | ICD-10-CM

## 2020-03-02 NOTE — Progress Notes (Signed)
Chief Complaint: GERD, dysphagia  Referring Provider:     Hoy Register, MD   HPI:     Rita Lynch is a 34 y.o. female with a history of asthma and COVID-19 01/2020 (s/p monoclonal antibody infusion), referred to the [facility] for evaluation of reflux symptoms.  Reflux symptoms predate recent Covid infection.  Symptoms present for last 1.5 years or so and worsening over time. Sxs characterized by heartburn, regurgitation, raspy voice.  Worse with greasy food, juice.  Will also have intermittent pain, pointing to anterior neck.  Intermittent solid food dysphagia- worse with steak, meats, french fries.  Can have SOB with reflux episodes. +dyspepsia. Has lost 9# over last 6 months. No hematochezia, melena, change in stools. No night sweats, fever, n/v.   Started taking Prilosec 20 mg intermittently last year with some response, but was moreso taking prn. Good control when taking daily x2 weeks, but then cycles off Rx as she wants to avoid daily Rx if possible.   No previous EGD or colonoscopy.   Has referral pending for Pulmonary.   Father with Colon CA diagnosed age 25. No known family history of other GI malignancy, liver disease, pancreatic disease, or IBD.    Past Medical History:  Diagnosis Date  . Anxiety   . COVID   . Pregnant 2014  . Umbilical hernia      History reviewed. No pertinent surgical history. Family History  Problem Relation Age of Onset  . Healthy Mother   . Healthy Father    Social History   Tobacco Use  . Smoking status: Never Smoker  . Smokeless tobacco: Never Used  Vaping Use  . Vaping Use: Never used  Substance Use Topics  . Alcohol use: Yes    Comment: occ  . Drug use: Never   Current Outpatient Medications  Medication Sig Dispense Refill  . acetaminophen (TYLENOL) 325 MG tablet Take 650 mg by mouth every 6 (six) hours as needed for mild pain.     Marland Kitchen albuterol (VENTOLIN HFA) 108 (90 Base) MCG/ACT inhaler  Inhale 2 puffs into the lungs every 6 (six) hours as needed for wheezing or shortness of breath. 18 g 1  . benzonatate (TESSALON) 200 MG capsule Take 1 capsule (200 mg total) by mouth 3 (three) times daily as needed for cough. 20 capsule 0  . dextromethorphan-guaiFENesin (MUCINEX DM) 30-600 MG 12hr tablet Take 1 tablet by mouth 2 (two) times daily. 20 tablet 0  . omeprazole (PRILOSEC) 20 MG capsule Take 1 capsule (20 mg total) by mouth daily. 30 capsule 1  . promethazine-dextromethorphan (PROMETHAZINE-DM) 6.25-15 MG/5ML syrup Take 5 mLs by mouth 4 (four) times daily as needed for cough. 100 mL 0   No current facility-administered medications for this visit.   No Known Allergies   Review of Systems: All systems reviewed and negative except where noted in HPI.     Physical Exam:    Wt Readings from Last 3 Encounters:  03/02/20 126 lb (57.2 kg)  02/23/20 125 lb (56.7 kg)  02/04/20 126 lb (57.2 kg)    BP 112/74   Pulse 78   Ht 5\' 1"  (1.549 m)   Wt 126 lb (57.2 kg)   BMI 23.81 kg/m  Constitutional:  Pleasant, in no acute distress. Psychiatric: Normal mood and affect. Behavior is normal. EENT: Pupils normal.  Conjunctivae are normal. No scleral icterus. Neck supple. No cervical LAD. Cardiovascular: Normal rate, regular  rhythm. No edema Pulmonary/chest: Effort normal and breath sounds normal. No wheezing, rales or rhonchi. Abdominal: Soft, nondistended, nontender. Bowel sounds active throughout. There are no masses palpable. No hepatomegaly. Neurological: Alert and oriented to person place and time. Skin: Skin is warm and dry. No rashes noted.   ASSESSMENT AND PLAN;   1) Heartburn 2) GERD 3) Dysphagia 4) Dyspepsia  -EGD to evaluate for erosive esophagitis, LES laxity, hiatal hernia along with stricture, ring, luminal narrowing -Esophageal dilation and esophageal and gastric biopsies as appropriate -Patient has been holding PPI for the last month.  We will continue holding  for now and plan for expedited EGD -Also seems to be anxiety overlay given the associated SOB and occasional palpitations with reflux episodes  5) Shortness of breath -As above, there seems to be a component of anxiety overlying -Has referral to Pulmonary Clinic per patient  6) Family history of colon cancer -Following colon cancer diagnosed at age 58 -Per current guidelines, does not meet criteria for early CRC screening -Colonoscopy at age 26  The indications, risks, and benefits of EGD were explained to the patient in detail. Risks include but are not limited to bleeding, perforation, adverse reaction to medications, and cardiopulmonary compromise. Sequelae include but are not limited to the possibility of surgery, hositalization, and mortality. The patient verbalized understanding and wished to proceed. All questions answered, referred to scheduler. Further recommendations pending results of the exam.     Shellia Cleverly, DO, FACG  03/02/2020, 2:30 PM   Hoy Register, MD

## 2020-03-02 NOTE — Patient Instructions (Signed)
If you are age 35 or older, your body mass index should be between 23-30. Your Body mass index is 23.81 kg/m. If this is out of the aforementioned range listed, please consider follow up with your Primary Care Provider.  If you are age 47 or younger, your body mass index should be between 19-25. Your Body mass index is 23.81 kg/m. If this is out of the aformentioned range listed, please consider follow up with your Primary Care Provider.   Please complete the financial assistance forms and once completed call to schedule your EGD.   It was a pleasure to see you today!  Vito Cirigliano, D.O.

## 2020-03-05 ENCOUNTER — Other Ambulatory Visit: Payer: Self-pay

## 2020-03-05 DIAGNOSIS — N6320 Unspecified lump in the left breast, unspecified quadrant: Secondary | ICD-10-CM

## 2020-03-18 ENCOUNTER — Ambulatory Visit
Admission: RE | Admit: 2020-03-18 | Discharge: 2020-03-18 | Disposition: A | Discharge status: Home / Self Care | Payer: No Typology Code available for payment source | Source: Ambulatory Visit | Attending: Obstetrics and Gynecology | Admitting: Obstetrics and Gynecology

## 2020-03-18 ENCOUNTER — Ambulatory Visit: Payer: Self-pay | Admitting: *Deleted

## 2020-03-18 ENCOUNTER — Other Ambulatory Visit: Payer: Self-pay

## 2020-03-18 VITALS — BP 116/64 | Wt 127.1 lb

## 2020-03-18 DIAGNOSIS — N6320 Unspecified lump in the left breast, unspecified quadrant: Secondary | ICD-10-CM

## 2020-03-18 DIAGNOSIS — Z1239 Encounter for other screening for malignant neoplasm of breast: Secondary | ICD-10-CM

## 2020-03-18 DIAGNOSIS — N6321 Unspecified lump in the left breast, upper outer quadrant: Secondary | ICD-10-CM

## 2020-03-18 DIAGNOSIS — N6325 Unspecified lump in the left breast, overlapping quadrants: Secondary | ICD-10-CM

## 2020-03-18 NOTE — Patient Instructions (Signed)
Explained breast self awareness with Rita Lynch. Patient did not need a Pap smear today due to last Pap smear was 12/29/2019. Let her know that her next Pap smear is due in one year due to it was abnormal with negative HPV. Referred patient to the [facility] for a diagnostic mammogram. Appointment scheduled Thursday, March 18, 2020 at 1310. Patient aware of appointment and will be there. [name] verbalized understanding.  Chael Urenda, Kathaleen Maser, RN 9:20 AM

## 2020-03-18 NOTE — Progress Notes (Signed)
Rita Lynch is a 34 y.o. female who presents to [facility] clinic today with complaint of left breast lump since July 2021 that comes and goes. Patient complained of left breast pain since October 2021 that comes and goes. Patient rates the pain at a 5 out of 10.    Pap Smear: Pap smear not completed today. Last Pap smear was 12/29/2019 at [facility] and was ASCUS with negative HPV. Per patient her most recent Pap smear is the only abnormal Pap smear she has had. Last Pap smear result is available in Epic.   Physical exam: Breasts Breasts symmetrical. No skin abnormalities bilateral breasts. No nipple retraction bilateral breasts. No nipple discharge bilateral breasts. No lymphadenopathy. No lumps palpated right breast. Palpated two lumps within the left breast on exam. Palpated a mobile bb sized lump at 2 o'clock 3.5 cm from the nipple and a lump at 12 o'clock 1 cm from the nipple. No complaints of pain or tenderness on exam.     Pelvic/Bimanual Pap is not indicated today per BCCCP guidelines.   Smoking History: Patient has never smoked.   Patient Navigation: Patient education provided. Access to services provided for patient through BCCCP program.    Breast and Cervical Cancer Risk Assessment: Patient does not have family history of breast cancer, known genetic mutations, or radiation treatment to the chest before age 74. Patient does not have history of cervical dysplasia, immunocompromised, or DES exposure in-utero.  Risk Assessment    Risk Scores      03/18/2020   Last edited by: Narda Rutherford, LPN   5-year risk: 0.2 %   Lifetime risk: 5.8 %          A: BCCCP exam without pap smear Complaint of left breast lump and pain.  P: Referred patient to the [facility] for a diagnostic mammogram. Appointment scheduled Thursday, March 18, 2020 at 1310.  Priscille Heidelberg, RN 03/18/2020 9:20 AM

## 2020-04-07 ENCOUNTER — Ambulatory Visit: Payer: Self-pay | Attending: Family Medicine | Admitting: Internal Medicine

## 2020-04-07 ENCOUNTER — Other Ambulatory Visit: Payer: Self-pay

## 2020-04-07 DIAGNOSIS — R0600 Dyspnea, unspecified: Secondary | ICD-10-CM

## 2020-04-07 DIAGNOSIS — R0689 Other abnormalities of breathing: Secondary | ICD-10-CM

## 2020-04-07 MED ORDER — QVAR REDIHALER 40 MCG/ACT IN AERB: 2.0000 | INHALATION_SPRAY | Freq: Two times a day (BID) | RESPIRATORY_TRACT | 3 refills | Status: DC

## 2020-04-07 NOTE — Progress Notes (Signed)
Called patient- she answered. Discussed the purpose of telephone visit and limitations - she agrees to proceed.  She complains of SOB at times for several weeks (3-4 months). She associates this with a dry mouth. She has been using an inhaler with relief at times. She will have a cough at times and she notes that she can get SOB at times with exercise.  She tells me that she had Covid in October of 2021. (the only covid record I have is 9/21- negative).   SOB- could be bronchospasm- these sxs started prior to covid diagnosis. I am inclined to treat with a steroid inhaler with albuterol prn. I also agree with Dr. Alvis Lemmings that PFTs are indicated. Reviewed previousl CXR (normal).

## 2020-05-01 ENCOUNTER — Inpatient Hospital Stay (HOSPITAL_COMMUNITY): Admission: RE | Admit: 2020-05-01 | Payer: No Typology Code available for payment source | Source: Ambulatory Visit

## 2020-05-17 ENCOUNTER — Other Ambulatory Visit (HOSPITAL_COMMUNITY)
Admission: RE | Admit: 2020-05-17 | Discharge: 2020-05-17 | Disposition: A | Discharge status: Home / Self Care | Payer: No Typology Code available for payment source | Source: Ambulatory Visit | Attending: Family Medicine | Admitting: Family Medicine

## 2020-05-17 DIAGNOSIS — Z01812 Encounter for preprocedural laboratory examination: Secondary | ICD-10-CM | POA: Insufficient documentation

## 2020-05-17 DIAGNOSIS — Z20822 Contact with and (suspected) exposure to covid-19: Secondary | ICD-10-CM | POA: Insufficient documentation

## 2020-05-17 LAB — SARS CORONAVIRUS 2 (TAT 6-24 HRS): SARS Coronavirus 2: NEGATIVE

## 2020-05-20 ENCOUNTER — Other Ambulatory Visit: Payer: Self-pay

## 2020-05-20 ENCOUNTER — Ambulatory Visit (INDEPENDENT_AMBULATORY_CARE_PROVIDER_SITE_OTHER): Payer: Self-pay | Admitting: Internal Medicine

## 2020-05-20 DIAGNOSIS — R0600 Dyspnea, unspecified: Secondary | ICD-10-CM

## 2020-05-20 DIAGNOSIS — R0689 Other abnormalities of breathing: Secondary | ICD-10-CM

## 2020-05-20 LAB — PULMONARY FUNCTION TEST
DL/VA % pred: 119 %
DL/VA: 5.52 ml/min/mmHg/L
DLCO cor % pred: 125 %
DLCO cor: 25.1 ml/min/mmHg
DLCO unc % pred: 125 %
DLCO unc: 25.1 ml/min/mmHg
FEF 25-75 Post: 3.22 L/sec
FEF 25-75 Pre: 2.97 L/sec
FEF2575-%Change-Post: 8 %
FEF2575-%Pred-Post: 102 %
FEF2575-%Pred-Pre: 94 %
FEV1-%Change-Post: 3 %
FEV1-%Pred-Post: 105 %
FEV1-%Pred-Pre: 102 %
FEV1-Post: 2.99 L
FEV1-Pre: 2.9 L
FEV1FVC-%Change-Post: 4 %
FEV1FVC-%Pred-Pre: 98 %
FEV6-%Change-Post: -1 %
FEV6-%Pred-Post: 104 %
FEV6-%Pred-Pre: 105 %
FEV6-Post: 3.49 L
FEV6-Pre: 3.53 L
FEV6FVC-%Pred-Post: 101 %
FEV6FVC-%Pred-Pre: 101 %
FVC-%Change-Post: -1 %
FVC-%Pred-Post: 103 %
FVC-%Pred-Pre: 104 %
FVC-Post: 3.49 L
FVC-Pre: 3.53 L
Post FEV1/FVC ratio: 86 %
Post FEV6/FVC ratio: 100 %
Pre FEV1/FVC ratio: 82 %
Pre FEV6/FVC Ratio: 100 %
RV % pred: 108 %
RV: 1.44 L
TLC % pred: 103 %
TLC: 4.76 L

## 2020-05-20 NOTE — Progress Notes (Signed)
PFT done today. 

## 2020-06-10 ENCOUNTER — Encounter (HOSPITAL_BASED_OUTPATIENT_CLINIC_OR_DEPARTMENT_OTHER): Payer: Self-pay

## 2020-06-10 ENCOUNTER — Other Ambulatory Visit: Payer: Self-pay

## 2020-06-10 ENCOUNTER — Emergency Department (HOSPITAL_BASED_OUTPATIENT_CLINIC_OR_DEPARTMENT_OTHER): Payer: No Typology Code available for payment source

## 2020-06-10 ENCOUNTER — Emergency Department (HOSPITAL_BASED_OUTPATIENT_CLINIC_OR_DEPARTMENT_OTHER)
Admission: EM | Admit: 2020-06-10 | Discharge: 2020-06-10 | Disposition: A | Discharge status: Home / Self Care | Payer: No Typology Code available for payment source | Attending: Emergency Medicine | Admitting: Emergency Medicine

## 2020-06-10 DIAGNOSIS — Z3A00 Weeks of gestation of pregnancy not specified: Secondary | ICD-10-CM | POA: Insufficient documentation

## 2020-06-10 DIAGNOSIS — O4691 Antepartum hemorrhage, unspecified, first trimester: Secondary | ICD-10-CM | POA: Insufficient documentation

## 2020-06-10 DIAGNOSIS — Z8616 Personal history of COVID-19: Secondary | ICD-10-CM | POA: Insufficient documentation

## 2020-06-10 DIAGNOSIS — O4690 Antepartum hemorrhage, unspecified, unspecified trimester: Secondary | ICD-10-CM

## 2020-06-10 DIAGNOSIS — O3680X0 Pregnancy with inconclusive fetal viability, not applicable or unspecified: Secondary | ICD-10-CM

## 2020-06-10 LAB — CBC
HCT: 39.2 % (ref 36.0–46.0)
Hemoglobin: 13.3 g/dL (ref 12.0–15.0)
MCH: 29.1 pg (ref 26.0–34.0)
MCHC: 33.9 g/dL (ref 30.0–36.0)
MCV: 85.8 fL (ref 80.0–100.0)
Platelets: 194 10*3/uL (ref 150–400)
RBC: 4.57 MIL/uL (ref 3.87–5.11)
RDW: 13.2 % (ref 11.5–15.5)
WBC: 5.7 10*3/uL (ref 4.0–10.5)
nRBC: 0 % (ref 0.0–0.2)

## 2020-06-10 LAB — URINALYSIS, ROUTINE W REFLEX MICROSCOPIC
Bilirubin Urine: NEGATIVE
Glucose, UA: NEGATIVE mg/dL
Ketones, ur: NEGATIVE mg/dL
Leukocytes,Ua: NEGATIVE
Nitrite: NEGATIVE
Protein, ur: NEGATIVE mg/dL
Specific Gravity, Urine: 1.03 (ref 1.005–1.030)
pH: 6 (ref 5.0–8.0)

## 2020-06-10 LAB — PREGNANCY, URINE: Preg Test, Ur: POSITIVE — AB

## 2020-06-10 LAB — URINALYSIS, MICROSCOPIC (REFLEX)

## 2020-06-10 LAB — HCG, QUANTITATIVE, PREGNANCY: hCG, Beta Chain, Quant, S: 4135 m[IU]/mL — ABNORMAL HIGH (ref <5)

## 2020-06-10 NOTE — ED Notes (Signed)
Patient transported to [facility] 

## 2020-06-10 NOTE — ED Notes (Signed)
ED Provider at bedside. 

## 2020-06-10 NOTE — ED Notes (Signed)
Pt reports positive pregnancy test last week. Started bleeding Monday light bleeding. Wednesday started bleeding again and heavier.  Pt went to bathroom and passed a large amount of tissue. Pt complains of cramps with the bleeding

## 2020-06-10 NOTE — ED Provider Notes (Signed)
[facility] EMERGENCY DEPARTMENT Provider Note   CSN: [id number] Arrival date & time: 06/10/20  3903     History Chief Complaint  Patient presents with  . Vaginal Bleeding    Rita Lynch is a 35 y.o. female who is G4P3 presenting to ED with vaginal bleeding and cramping.  Patient reports last menstrual ended 04/29/20.  She noted she had missed her period in February.  She checked last week with a home pregnancy test and was positive.  She did not realize she been pregnant prior to that.  She reports that 3 to 4 days ago she began having vaginal bleeding and spotting.  Last night she feels like she passed "something very large into the toilet".  This morning she feels like the bleeding has more or less stopped since 6 AM.  She denies any prior history of miscarriage or abortion.  She had 3 prior pregnancy which were natural vaginal deliveries with 3 living children.  She denies any history of anemia.  She denies any other medical problems.  She does not drink alcohol or use any recreational drugs.  She works as a Financial trader.  HPI     Past Medical History:  Diagnosis Date  . Allergy   . Anxiety   . COVID   . Pregnant 2014  . Umbilical hernia     Patient Active Problem List   Diagnosis Date Noted  . COVID 01/29/2020  . Active labor at term 07/01/2013    History reviewed. No pertinent surgical history.   OB History    Gravida  4   Para  4   Term  4   Preterm      AB      Living  4     SAB      IAB      Ectopic      Multiple      Live Births  3           Family History  Problem Relation Age of Onset  . Healthy Mother   . Healthy Father   . Diabetes Sister   . Diabetes Sister     Social History   Tobacco Use  . Smoking status: Never Smoker  . Smokeless tobacco: Never Used  Vaping Use  . Vaping Use: Never used  Substance Use Topics  . Alcohol use: Yes    Comment: occ  . Drug use: Never    Home Medications Prior  to Admission medications   Medication Sig Start Date End Date Taking? Authorizing Provider  acetaminophen (TYLENOL) 325 MG tablet Take 650 mg by mouth every 6 (six) hours as needed for mild pain.     [provider]  albuterol (VENTOLIN HFA) 108 (90 Base) MCG/ACT inhaler Inhale 2 puffs into the lungs every 6 (six) hours as needed for wheezing or shortness of breath. 02/23/20   Hoy Register, MD  beclomethasone (QVAR REDIHALER) 40 MCG/ACT inhaler Inhale 2 puffs into the lungs 2 (two) times daily. 04/07/20   Swords, Valetta Mole, MD  benzonatate (TESSALON) 200 MG capsule Take 1 capsule (200 mg total) by mouth 3 (three) times daily as needed for cough. 01/28/20   Particia Nearing, PA-C  dextromethorphan-guaiFENesin [facility] DM) 30-600 MG 12hr tablet Take 1 tablet by mouth 2 (two) times daily. 02/04/20   Wieters, Hallie C, PA-C  omeprazole (PRILOSEC) 20 MG capsule Take 1 capsule (20 mg total) by mouth daily. 12/24/19   Terrilee Files, MD  promethazine-dextromethorphan (PROMETHAZINE-DM) 6.25-15 MG/5ML syrup Take 5 mLs by mouth 4 (four) times daily as needed for cough. 01/28/20   Particia Nearing, PA-C    Allergies    Patient has no known allergies.  Review of Systems   Review of Systems  Constitutional: Negative for chills and fever.  Respiratory: Negative for cough and shortness of breath.   Cardiovascular: Negative for chest pain and palpitations.  Gastrointestinal: Negative for abdominal pain and vomiting.  Genitourinary: Positive for pelvic pain and vaginal bleeding. Negative for dysuria and vaginal discharge.  Musculoskeletal: Negative for arthralgias and back pain.  Skin: Negative for color change and rash.  Neurological: Negative for syncope and light-headedness.  All other systems reviewed and are negative.   Physical Exam Updated Vital Signs BP 95/63 (BP Location: Left Arm)   Pulse 75   Temp 97.8 F (36.6 C) (Oral)   Resp 14   Ht 5\' 1"  (1.549 m)   Wt 59 kg    SpO2 100%   BMI 24.56 kg/m   Physical Exam Constitutional:      General: She is not in acute distress. HENT:     Head: Normocephalic and atraumatic.  Eyes:     Conjunctiva/sclera: Conjunctivae normal.     Pupils: Pupils are equal, round, and reactive to light.  Cardiovascular:     Rate and Rhythm: Normal rate and regular rhythm.  Pulmonary:     Effort: Pulmonary effort is normal. No respiratory distress.  Abdominal:     General: There is no distension.     Tenderness: There is no abdominal tenderness. There is no guarding.  Skin:    General: Skin is warm and dry.  Neurological:     General: No focal deficit present.     Mental Status: She is alert. Mental status is at baseline.  Psychiatric:        Mood and Affect: Mood normal.        Behavior: Behavior normal.     ED Results / Procedures / Treatments   Labs (all labs ordered are listed, but only abnormal results are displayed) Labs Reviewed  PREGNANCY, URINE - Abnormal; Notable for the following components:      Result Value   Preg Test, Ur POSITIVE (*)    All other components within normal limits  URINALYSIS, ROUTINE W REFLEX MICROSCOPIC - Abnormal; Notable for the following components:   Hgb urine dipstick LARGE (*)    All other components within normal limits  HCG, QUANTITATIVE, PREGNANCY - Abnormal; Notable for the following components:   hCG, Beta Chain, Quant, S 4,135 (*)    All other components within normal limits  URINALYSIS, MICROSCOPIC (REFLEX) - Abnormal; Notable for the following components:   Bacteria, UA FEW (*)    All other components within normal limits  CBC    EKG None  Radiology OB LESS THAN 14 WEEKS WITH OB TRANSVAGINAL  Result Date: 06/10/2020 CLINICAL DATA:  Bleeding during pregnancy EXAM: OBSTETRIC <14 WK 06/12/2020 AND TRANSVAGINAL OB US TECHNIQUE: Both transabdominal and transvaginal ultrasound examinations were performed for complete evaluation of the gestation as well as the maternal  uterus, adnexal regions, and pelvic cul-de-sac. Transvaginal technique was performed to assess early pregnancy. COMPARISON:  None. FINDINGS: Intrauterine gestational sac: None Yolk sac:  Not Visualized. Embryo:  Not Visualized. Cardiac Activity: Not Visualized. Subchorionic hemorrhage:  None visualized. Maternal uterus/adnexae: Normal appearance of the right ovary. The left ovary could not be visualized. The endometrium is thickened with a small  amount of heterogeneous contents and fluid. No free fluid in the pelvis. IMPRESSION: 1. No IUP is visualized. By definition, in the setting of a positive pregnancy test, this reflects a pregnancy of unknown location. Differential considerations include early normal IUP, abnormal IUP/missed abortion, or nonvisualized ectopic pregnancy. Serial beta HCG is suggested. Consider repeat pelvic ultrasound in 14 days. 2. The endometrium is thickened with a small amount of fluid which may be related to reported bleeding. Electronically Signed   By: Emmaline Kluver M.D.   On: 06/10/2020 10:55    Procedures Procedures   Medications Ordered in ED Medications - No data to display  ED Course  I have reviewed the triage vital signs and the nursing notes.  Pertinent labs & imaging results that were available during my care of the patient were reviewed by me and considered in my medical decision making (see chart for details).  Vaginal bleeding during pregnancy Most likely had spontaneous abortion last night, passing PoC We'll check an hcg level and get an OB ultrasound for ectopic rule out  She is not actively bleeding in ED.  Vitals wnl.  Hgb normal.  Doubt significant blood loss anemia.  *  Ultrasound reviewed with patients - pregnancy of unknown location workup.  Explained it is likely miscarriage from yesterday, but we cannot be certain and she needs repeat hormone level testing.  Discussed options for getting this done, and will discharge now.  Clinical Course  as of 06/10/20 1145  Thu Jun 10, 2020  1101 IMPRESSION: 1. No IUP is visualized. By definition, in the setting of a positive pregnancy test, this reflects a pregnancy of unknown location. Differential considerations include early normal IUP, abnormal IUP/missed abortion, or nonvisualized ectopic pregnancy. Serial beta HCG is suggested. Consider repeat pelvic ultrasound in 14 days.  2. The endometrium is thickened with a small amount of fluid which may be related to reported bleeding [MT]    Clinical Course User Index [MT] Alexianna Nachreiner, Kermit Balo, MD    Final Clinical Impression(s) / ED Diagnoses Final diagnoses:  Vaginal bleeding in pregnancy  Pregnancy of unknown anatomic location    Rx / DC Orders ED Discharge Orders    None       Terald Sleeper, MD 06/10/20 1145

## 2020-06-10 NOTE — Discharge Instructions (Addendum)
Your ultrasound today did NOT show a pregnancy inside of your uterus.  This can mean several things.  The most likely explanation is that you had a miscarriage yesterday.  Your bleeding and cramping should get better over the next 2 days.    It is also possible that you have a very early living pregnancy we cannot see on ultrasound yet.  Therefore you should assume that you may still be pregnant and avoid drinking, using drugs, or starting any new medications until you know you are not pregnant.   Finally, it is also possible you have a pregnancy outside of your uterus, called an ectopic pregnancy.  This is rare, but it can be dangerous or life threatening.    Therefore you need to have a blood test repeated in 3-4 days to check your pregnancy hormone levels.  This will help tell us whether your pregnancy has ended or not.  *  Call your doctor's office today or tomorrow to ask if they can order this test, called an "hcg" level of the blood.  You can also try to make an appointment at the local [facility] point, at the number circled above.  If you cannot do either option, on Monday you can go to the [facility] in [place], located at the [facility] at the address below.   [address]tr[address] [place],  [place]  [address]

## 2020-06-10 NOTE — ED Triage Notes (Signed)
Pt states took home pregnancy test last week, was positive.  Started with light bleeding Monday, yesterday worsened with cramping and passing of clot.  1 pad every 3-4 hours

## 2020-06-14 ENCOUNTER — Ambulatory Visit (INDEPENDENT_AMBULATORY_CARE_PROVIDER_SITE_OTHER): Payer: Self-pay | Admitting: *Deleted

## 2020-06-14 ENCOUNTER — Other Ambulatory Visit: Payer: Self-pay

## 2020-06-14 VITALS — BP 106/65 | HR 65 | Ht 61.0 in | Wt 134.8 lb

## 2020-06-14 DIAGNOSIS — O039 Complete or unspecified spontaneous abortion without complication: Secondary | ICD-10-CM

## 2020-06-14 LAB — BETA HCG QUANT (REF LAB): hCG Quant: 450 m[IU]/mL

## 2020-06-14 NOTE — Progress Notes (Addendum)
Pt seen @ [facility] ED on 2/24 due to bleeding and felt to have had possible miscarriage. She reports light spotting yesterday - pink. Today she only has brown spotting. She has not had any cramping x2 days. Stat BHCG drawn.  Pt was advised that she will be called with results later today. She stated that a detailed message can be left on her voicemail if she does not answer.   1155  BHCG results - (450) reviewed by Dr. Earlene Plater who finds significant decrease in level which is consistent with miscarriage. Pt will need repeat non-stat BHCG in one week. Pt was called and informed of test results and likely miscarriage. She agreed to plan of care for repeat BHCG in one week and will be notified of appt details via Mychart. Pt voiced understanding of all information given.

## 2020-06-15 NOTE — Progress Notes (Signed)
I have reviewed this chart and agree with the RN/CMA assessment and management.    K. Meryl Tylea Hise, MD, FACOG Attending [facility] (Faculty Practice)  

## 2020-06-17 ENCOUNTER — Other Ambulatory Visit: Payer: Self-pay | Admitting: *Deleted

## 2020-06-17 DIAGNOSIS — O039 Complete or unspecified spontaneous abortion without complication: Secondary | ICD-10-CM

## 2020-06-21 ENCOUNTER — Other Ambulatory Visit: Payer: Self-pay

## 2020-06-21 DIAGNOSIS — O039 Complete or unspecified spontaneous abortion without complication: Secondary | ICD-10-CM

## 2020-06-22 LAB — BETA HCG QUANT (REF LAB): hCG Quant: 185 m[IU]/mL

## 2020-06-28 ENCOUNTER — Telehealth: Payer: Self-pay

## 2020-06-28 ENCOUNTER — Other Ambulatory Visit: Payer: No Typology Code available for payment source

## 2020-06-28 ENCOUNTER — Other Ambulatory Visit: Payer: Self-pay | Admitting: General Practice

## 2020-06-28 DIAGNOSIS — O039 Complete or unspecified spontaneous abortion without complication: Secondary | ICD-10-CM

## 2020-06-28 NOTE — Telephone Encounter (Addendum)
-----   Message from Conan Bowens, MD sent at 06/24/2020 12:33 PM EST ----- Decreasing HCG, repeat 1 week  Called pt; unable to leave VM due to mailbox being full. Per chart review pt was scheduled for 1 week follow up lab appt this morning and did not come to appt. Called a second time; pt answered. Explained need for lab appt, pt agreeable to come tomorrow AM for lab draw. Front office notified to reschedule.

## 2020-06-29 ENCOUNTER — Other Ambulatory Visit: Payer: Self-pay

## 2020-06-29 ENCOUNTER — Other Ambulatory Visit: Payer: Self-pay | Admitting: Lactation Services

## 2020-06-30 LAB — BETA HCG QUANT (REF LAB): hCG Quant: 81 m[IU]/mL

## 2020-07-01 ENCOUNTER — Telehealth: Payer: Self-pay | Admitting: Lactation Services

## 2020-07-01 NOTE — Telephone Encounter (Signed)
-----   Message from Conan Bowens, MD sent at 07/01/2020  3:44 PM EDT ----- Decreasing HCG, repeat 1 week

## 2020-07-01 NOTE — Telephone Encounter (Signed)
Called patient with results of HCG and recommendation for follow up HCG in 1 week.   Patient is able to come to have follow up Non stat beta at 9 am on 3/24. Message to front office to put on lab schedule.

## 2020-07-06 ENCOUNTER — Ambulatory Visit (INDEPENDENT_AMBULATORY_CARE_PROVIDER_SITE_OTHER): Payer: Self-pay | Admitting: Lactation Services

## 2020-07-06 ENCOUNTER — Other Ambulatory Visit: Payer: Self-pay

## 2020-07-06 ENCOUNTER — Other Ambulatory Visit: Payer: Self-pay | Admitting: Lactation Services

## 2020-07-06 DIAGNOSIS — O039 Complete or unspecified spontaneous abortion without complication: Secondary | ICD-10-CM

## 2020-07-06 NOTE — Progress Notes (Signed)
Patient her for lab appointment for non stat Beta. She has no concerns. She has no bleeding or pain. She was informed her levels will return tomorrow and she will be called with results and recommendations.

## 2020-07-07 LAB — BETA HCG QUANT (REF LAB): hCG Quant: 5 m[IU]/mL

## 2020-07-07 NOTE — Progress Notes (Signed)
I have reviewed and agree with the findings and care plan documented in the RN note.   Anthone Prieur, MD OB Family Medicine Fellow, Faculty Practice [facility],  Medical Group  

## 2020-08-04 ENCOUNTER — Ambulatory Visit: Payer: No Typology Code available for payment source | Admitting: Family Medicine

## 2020-09-07 IMAGING — DX DG CHEST 2V
2 series · 2 of 2 positions shown · non-contrast
Comparison: Radiograph April 05, 2018.

CLINICAL DATA: Shortness of breath.

EXAM:
CHEST - 2 VIEW

[chest pa]
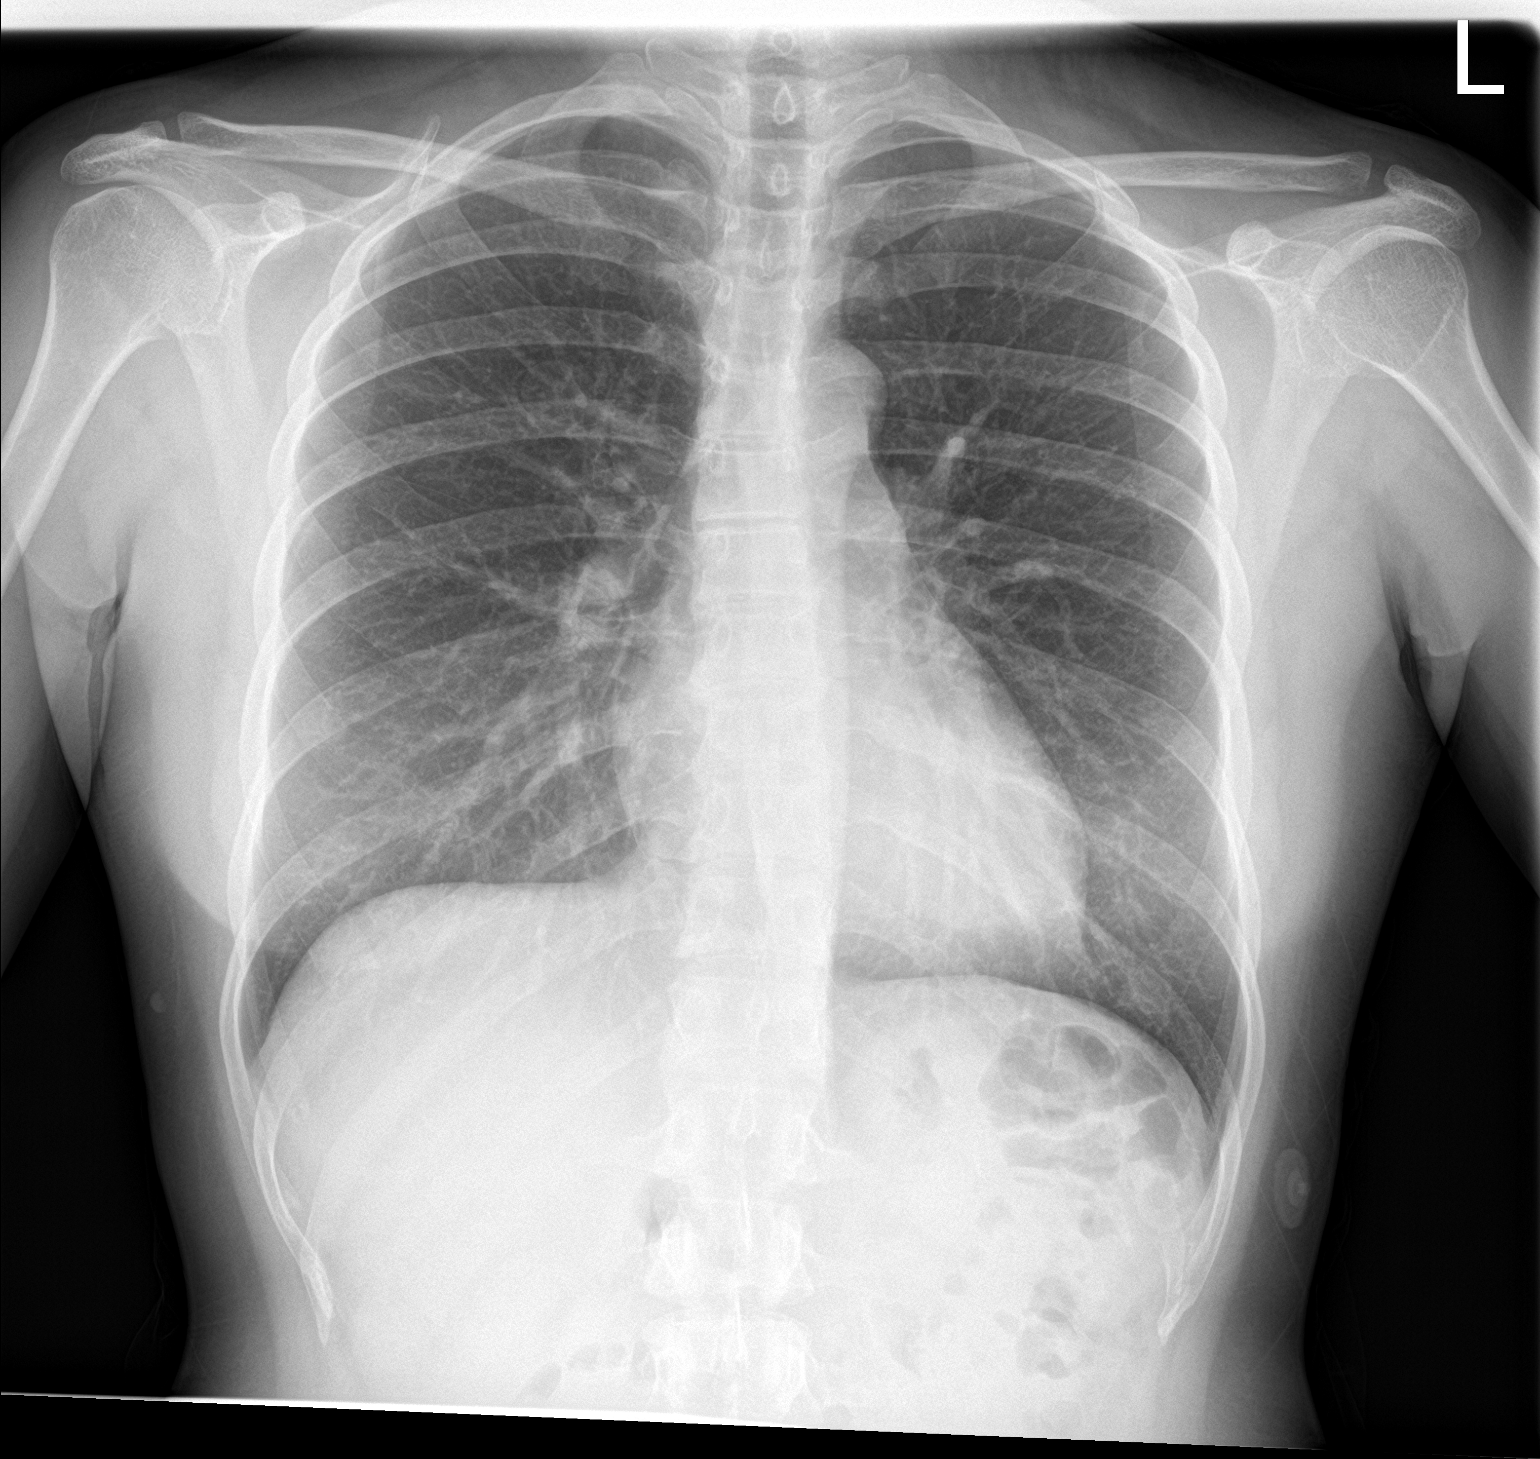

[chest lat]
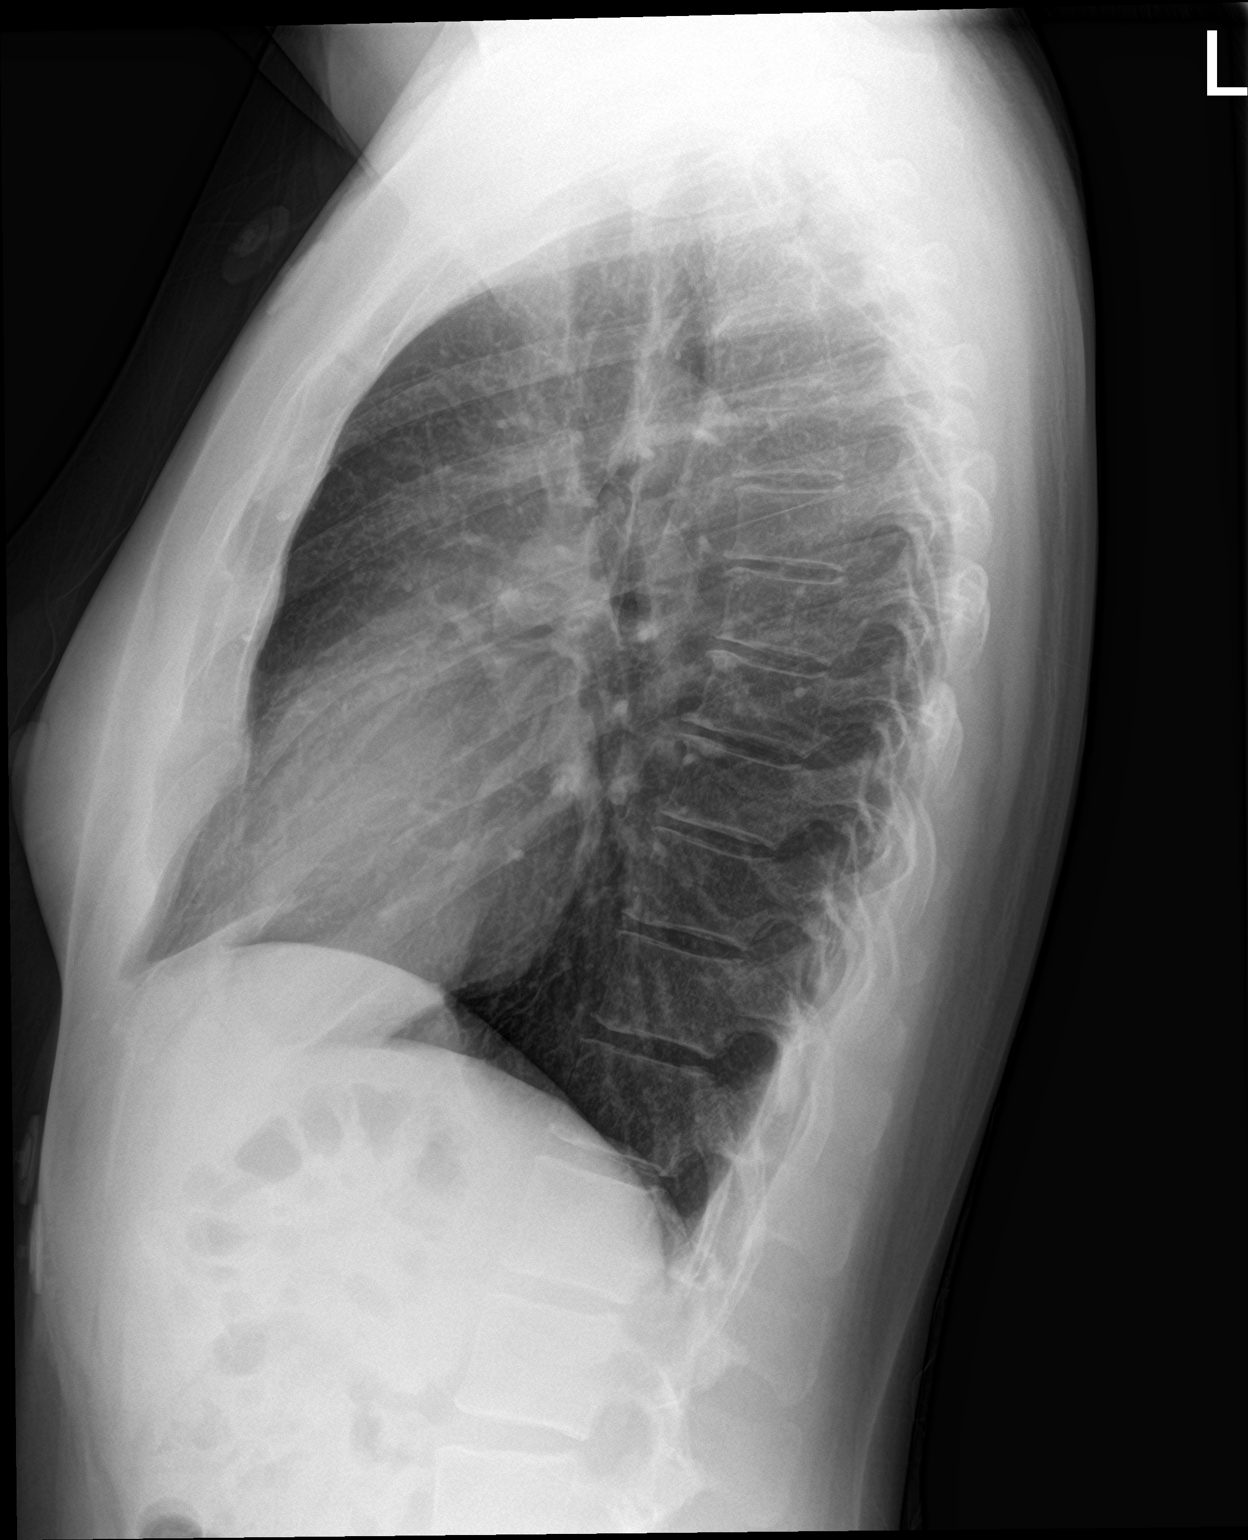

[2 of 2 positions shown; findings below may reference images not displayed]

FINDINGS: The heart size and mediastinal contours are within normal limits.
Both lungs are clear. No pneumothorax or pleural effusion is noted.
The visualized skeletal structures are unremarkable.
IMPRESSION: No active cardiopulmonary disease.

## 2020-09-24 ENCOUNTER — Ambulatory Visit: Payer: No Typology Code available for payment source

## 2020-10-13 ENCOUNTER — Encounter: Payer: Self-pay | Admitting: Family Medicine

## 2020-10-13 ENCOUNTER — Ambulatory Visit: Payer: No Typology Code available for payment source | Admitting: Family Medicine

## 2020-10-13 NOTE — Progress Notes (Signed)
Patient did not keep appointment today. She may call to reschedule.  

## 2020-12-01 ENCOUNTER — Ambulatory Visit: Payer: No Typology Code available for payment source | Admitting: Family Medicine

## 2020-12-11 ENCOUNTER — Other Ambulatory Visit: Payer: Self-pay

## 2020-12-11 ENCOUNTER — Emergency Department (HOSPITAL_BASED_OUTPATIENT_CLINIC_OR_DEPARTMENT_OTHER): Payer: No Typology Code available for payment source

## 2020-12-11 ENCOUNTER — Emergency Department (HOSPITAL_BASED_OUTPATIENT_CLINIC_OR_DEPARTMENT_OTHER)
Admission: EM | Admit: 2020-12-11 | Discharge: 2020-12-11 | Disposition: A | Discharge status: Home / Self Care | Payer: No Typology Code available for payment source | Attending: Emergency Medicine | Admitting: Emergency Medicine

## 2020-12-11 DIAGNOSIS — R0789 Other chest pain: Secondary | ICD-10-CM

## 2020-12-11 DIAGNOSIS — Z8616 Personal history of COVID-19: Secondary | ICD-10-CM | POA: Insufficient documentation

## 2020-12-11 DIAGNOSIS — R0602 Shortness of breath: Secondary | ICD-10-CM | POA: Insufficient documentation

## 2020-12-11 DIAGNOSIS — R072 Precordial pain: Secondary | ICD-10-CM | POA: Insufficient documentation

## 2020-12-11 DIAGNOSIS — F419 Anxiety disorder, unspecified: Secondary | ICD-10-CM | POA: Insufficient documentation

## 2020-12-11 LAB — CBC
HCT: 37.4 % (ref 36.0–46.0)
Hemoglobin: 12.4 g/dL (ref 12.0–15.0)
MCH: 28.6 pg (ref 26.0–34.0)
MCHC: 33.2 g/dL (ref 30.0–36.0)
MCV: 86.4 fL (ref 80.0–100.0)
Platelets: 211 10*3/uL (ref 150–400)
RBC: 4.33 MIL/uL (ref 3.87–5.11)
RDW: 13.7 % (ref 11.5–15.5)
WBC: 9.5 10*3/uL (ref 4.0–10.5)
nRBC: 0 % (ref 0.0–0.2)

## 2020-12-11 LAB — BASIC METABOLIC PANEL
Anion gap: 10 (ref 5–15)
BUN: 10 mg/dL (ref 6–20)
CO2: 24 mmol/L (ref 22–32)
Calcium: 9.3 mg/dL (ref 8.9–10.3)
Chloride: 105 mmol/L (ref 98–111)
Creatinine, Ser: 0.78 mg/dL (ref 0.44–1.00)
GFR, Estimated: >60 mL/min (ref >60)
Glucose, Bld: 98 mg/dL (ref 70–99)
Potassium: 3.4 mmol/L — ABNORMAL LOW (ref 3.5–5.1)
Sodium: 139 mmol/L (ref 135–145)

## 2020-12-11 LAB — TROPONIN I (HIGH SENSITIVITY): Troponin I (High Sensitivity): <2 ng/L (ref <18)

## 2020-12-11 LAB — PREGNANCY, URINE: Preg Test, Ur: NEGATIVE

## 2020-12-11 NOTE — ED Provider Notes (Signed)
[facility] EMERGENCY DEPARTMENT Provider Note  CSN: [id number] Arrival date & time: 12/11/20 6720  Chief Complaint(s) Chest Pain  HPI Rita Lynch is a 35 y.o. female    Chest Pain Pain location:  Substernal area Pain quality: pressure   Pain radiates to:  Does not radiate Pain severity:  Moderate Duration:  3 hours Timing:  Constant Progression:  Resolved Chronicity:  Recurrent Relieved by: albuterol inhaler. Worsened by:  Nothing Associated symptoms: anxiety and shortness of breath (improved as well)   Associated symptoms: no back pain, no cough, no fever, no palpitations and no vomiting    Past Medical History Past Medical History:  Diagnosis Date   Allergy    Anxiety    COVID    Pregnant 2014   Umbilical hernia    Patient Active Problem List   Diagnosis Date Noted   COVID 01/29/2020   Active labor at term 07/01/2013   Home Medication(s) Prior to Admission medications   Medication Sig Start Date End Date Taking? Authorizing Provider  acetaminophen (TYLENOL) 325 MG tablet Take 650 mg by mouth every 6 (six) hours as needed for mild pain.     [provider]  albuterol (VENTOLIN HFA) 108 (90 Base) MCG/ACT inhaler Inhale 2 puffs into the lungs every 6 (six) hours as needed for wheezing or shortness of breath. 02/23/20   Hoy Register, MD  beclomethasone (QVAR REDIHALER) 40 MCG/ACT inhaler Inhale 2 puffs into the lungs 2 (two) times daily. 04/07/20   Swords, Valetta Mole, MD  benzonatate (TESSALON) 200 MG capsule Take 1 capsule (200 mg total) by mouth 3 (three) times daily as needed for cough. 01/28/20   Particia Nearing, PA-C  dextromethorphan-guaiFENesin [facility] DM) 30-600 MG 12hr tablet Take 1 tablet by mouth 2 (two) times daily. 02/04/20   Wieters, Hallie C, PA-C  omeprazole (PRILOSEC) 20 MG capsule Take 1 capsule (20 mg total) by mouth daily. 12/24/19   Terrilee Files, MD  promethazine-dextromethorphan (PROMETHAZINE-DM) 6.25-15  MG/5ML syrup Take 5 mLs by mouth 4 (four) times daily as needed for cough. 01/28/20   Particia Nearing, PA-C                                                                                                                                    Past Surgical History No past surgical history on file. Family History Family History  Problem Relation Age of Onset   Healthy Mother    Healthy Father    Diabetes Sister    Diabetes Sister     Social History Social History   Tobacco Use   Smoking status: Never   Smokeless tobacco: Never  Vaping Use   Vaping Use: Never used  Substance Use Topics   Alcohol use: Yes    Comment: occ   Drug use: Never   Allergies Patient has no known allergies.  Review of Systems Review of Systems  Constitutional:  Negative for fever.  Respiratory:  Positive for shortness of breath (improved as well). Negative for cough.   Cardiovascular:  Positive for chest pain. Negative for palpitations.  Gastrointestinal:  Negative for vomiting.  Musculoskeletal:  Negative for back pain.  All other systems are reviewed and are negative for acute change except as noted in the HPI  Physical Exam Vital Signs  I have reviewed the triage vital signs BP 113/75   Pulse 71   Temp 98.1 F (36.7 C) (Oral)   Resp 16   Ht 5\' 1"  (1.549 m)   Wt 63.5 kg   LMP 04/27/2020 (Exact Date)   SpO2 100%   BMI 26.45 kg/m   Physical Exam Vitals reviewed.  Constitutional:      General: She is not in acute distress.    Appearance: She is well-developed. She is not diaphoretic.  HENT:     Head: Normocephalic and atraumatic.     Nose: Nose normal.  Eyes:     General: No scleral icterus.       Right eye: No discharge.        Left eye: No discharge.     Conjunctiva/sclera: Conjunctivae normal.     Pupils: Pupils are equal, round, and reactive to light.  Cardiovascular:     Rate and Rhythm: Normal rate and regular rhythm.     Heart sounds: No murmur heard.   No friction  rub. No gallop.  Pulmonary:     Effort: Pulmonary effort is normal. No respiratory distress.     Breath sounds: Normal breath sounds. No stridor. No rales.  Abdominal:     General: There is no distension.     Palpations: Abdomen is soft.     Tenderness: There is no abdominal tenderness.  Musculoskeletal:        General: No tenderness.     Cervical back: Normal range of motion and neck supple.  Skin:    General: Skin is warm and dry.     Findings: No erythema or rash.  Neurological:     Mental Status: She is alert and oriented to person, place, and time.    ED Results and Treatments Labs (all labs ordered are listed, but only abnormal results are displayed) Labs Reviewed  BASIC METABOLIC PANEL - Abnormal; Notable for the following components:      Result Value   Potassium 3.4 (*)    All other components within normal limits  CBC  PREGNANCY, URINE  TROPONIN I (HIGH SENSITIVITY)                                                                                                                         EKG  EKG Interpretation  Date/Time:  Saturday December 11 2020 03:49:57 EDT Ventricular Rate:  75 PR Interval:  160 QRS Duration: 92 QT Interval:  394 QTC Calculation: 439 R Axis:   74 Text Interpretation: Normal sinus rhythm Incomplete right bundle branch block Nonspecific T wave abnormality Abnormal  ECG No significant change since last tracing Confirmed by Drema Pry [phone]) on 12/11/2020 4:24:40 AM       Radiology DG Chest 2 View  Result Date: 12/11/2020 CLINICAL DATA:  Chest pain and shortness of breath EXAM: CHEST - 2 VIEW COMPARISON:  02/04/2020 FINDINGS: The heart size and mediastinal contours are within normal limits. Both lungs are clear. The visualized skeletal structures are unremarkable. IMPRESSION: No active cardiopulmonary disease. Electronically Signed   By: Signa Kell M.D.   On: 12/11/2020 06:26    Pertinent labs & imaging results that were available during  my care of the patient were reviewed by me and considered in my medical decision making (see MDM for details).  Medications Ordered in ED Medications - No data to display                                                                                                                                   Procedures Procedures  (including critical care time)  Medical Decision Making / ED Course I have reviewed the nursing notes for this encounter and the patient's prior records (if available in EHR or on provided paperwork).  Kimorah Ridolfi was evaluated in Emergency Department on 12/11/2020 for the symptoms described in the history of present illness. She was evaluated in the context of the global COVID-19 pandemic, which necessitated consideration that the patient might be at risk for infection with the SARS-CoV-2 virus that causes COVID-19. Institutional protocols and algorithms that pertain to the evaluation of patients at risk for COVID-19 are in a state of rapid change based on information released by regulatory bodies including the CDC and federal and state organizations. These policies and algorithms were followed during the patient's care in the ED.     Atypical chest pain. Resolved with albuterol inhaler. Most consistent with prior episodes of reactive airway disease/asthma. Currently chest pain-free/symptom-free.  EKG without acute ischemic changes or evidence of pericarditis. Troponin 3 hours after symptom resolution negative. No need for repeat Trop as I feel ACS is unlikely.  Presentation not classic for arctic dissection or esophageal perforation.. Low suspicion for pulmonary embolism.  Pertinent labs & imaging results that were available during my care of the patient were reviewed by me and considered in my medical decision making:  Chest x-ray without evidence suggestive of pneumonia, pneumothorax, pneumomediastinum.  No abnormal contour of the mediastinum to suggest  dissection. No evidence of acute injuries.  Rested labs reassuring without leukocytosis or anemia.  No significant electrolyte derangements or renal sufficiency.  Final Clinical Impression(s) / ED Diagnoses Final diagnoses:  Tightness in chest   The patient appears reasonably screened and/or stabilized for discharge and I doubt any other medical condition or other [facility] requiring further screening, evaluation, or treatment in the ED at this time prior to discharge. Safe for discharge with strict return precautions.  Disposition: Discharge  Condition: Good  I have discussed the results,  Dx and Tx plan with the patient/family who expressed understanding and agree(s) with the plan. Discharge instructions discussed at length. The patient/family was given strict return precautions who verbalized understanding of the instructions. No further questions at time of discharge.    ED Discharge Orders     None         Follow Up: Hoy RegisterNewlin, En[place]ong, MD [address] [place] [id number] [phone]  Call  to schedule an appointment for close follow up     This chart was dictated using voice recognition software.  Despite best efforts to proofread,  errors can occur which can change the documentation meaning.    Nira Connardama, Leonora Gores Eduardo, MD 12/11/20 [phone]

## 2020-12-11 NOTE — ED Triage Notes (Signed)
Pt reports chest tightness, shob and dizziness starting around 0000 while laying down to go to sleep. Pt states she feels anxious.

## 2020-12-22 ENCOUNTER — Ambulatory Visit (INDEPENDENT_AMBULATORY_CARE_PROVIDER_SITE_OTHER): Payer: Self-pay | Admitting: Family Medicine

## 2020-12-22 ENCOUNTER — Other Ambulatory Visit: Payer: Self-pay

## 2020-12-22 ENCOUNTER — Encounter: Payer: Self-pay | Admitting: Family Medicine

## 2020-12-22 VITALS — BP 102/62 | HR 79

## 2020-12-22 DIAGNOSIS — Z30011 Encounter for initial prescription of contraceptive pills: Secondary | ICD-10-CM

## 2020-12-22 DIAGNOSIS — T7840XA Allergy, unspecified, initial encounter: Secondary | ICD-10-CM | POA: Insufficient documentation

## 2020-12-22 DIAGNOSIS — F419 Anxiety disorder, unspecified: Secondary | ICD-10-CM | POA: Insufficient documentation

## 2020-12-22 DIAGNOSIS — K429 Umbilical hernia without obstruction or gangrene: Secondary | ICD-10-CM | POA: Insufficient documentation

## 2020-12-22 DIAGNOSIS — J45909 Unspecified asthma, uncomplicated: Secondary | ICD-10-CM | POA: Insufficient documentation

## 2020-12-22 MED ORDER — NORETHIN ACE-ETH ESTRAD-FE 1-20 MG-MCG(24) PO TABS: 1.0000 | ORAL_TABLET | Freq: Every day | ORAL | 3 refills | Status: DC

## 2020-12-22 NOTE — Progress Notes (Signed)
   Subjective:    Patient ID: Rita Lynch is a 35 y.o. female presenting with Contraception  on 12/22/2020  HPI: Has had IUD but had no cycle and that did not work for her. LMP was 3 weeks ago.Using condoms presently.  Review of Systems  Constitutional:  Negative for chills and fever.  Respiratory:  Negative for shortness of breath.   Cardiovascular:  Negative for chest pain.  Gastrointestinal:  Negative for abdominal pain, nausea and vomiting.  Genitourinary:  Negative for dysuria.  Skin:  Negative for rash.     Objective:    BP 102/62   Pulse 79   LMP 04/27/2020 (Exact Date)   Breastfeeding Unknown  Physical Exam Constitutional:      General: She is not in acute distress.    Appearance: She is well-developed.  HENT:     Head: Normocephalic and atraumatic.  Eyes:     General: No scleral icterus. Cardiovascular:     Rate and Rhythm: Normal rate.  Pulmonary:     Effort: Pulmonary effort is normal.  Abdominal:     Palpations: Abdomen is soft.  Musculoskeletal:     Cervical back: Neck supple.  Skin:    General: Skin is warm and dry.  Neurological:     Mental Status: She is alert and oriented to person, place, and time.        Assessment & Plan:  Encounter for initial prescription of contraceptive pills - all options reviewed. Desires trial of OC's. Sunday start after next menses. - Plan: Norethindrone Acetate-Ethinyl Estrad-FE (LOESTRIN 24 FE) 1-20 MG-MCG(24) tablet Risks of OCs, side fx reviewed.  No follow-ups on file.  Reva Bores 12/22/2020 3:31 PM

## 2021-01-18 ENCOUNTER — Inpatient Hospital Stay: Payer: No Typology Code available for payment source | Admitting: Family Medicine

## 2021-01-28 ENCOUNTER — Inpatient Hospital Stay: Payer: No Typology Code available for payment source | Admitting: Nurse Practitioner

## 2021-04-20 ENCOUNTER — Inpatient Hospital Stay: Payer: Self-pay | Admitting: Physician Assistant

## 2021-05-09 ENCOUNTER — Other Ambulatory Visit: Payer: Self-pay

## 2021-05-09 ENCOUNTER — Ambulatory Visit (HOSPITAL_COMMUNITY)
Admission: EM | Admit: 2021-05-09 | Discharge: 2021-05-09 | Disposition: A | Discharge status: Home / Self Care | Payer: Self-pay | Attending: Student | Admitting: Student

## 2021-05-09 ENCOUNTER — Encounter (HOSPITAL_COMMUNITY): Payer: Self-pay

## 2021-05-09 DIAGNOSIS — J4521 Mild intermittent asthma with (acute) exacerbation: Secondary | ICD-10-CM

## 2021-05-09 DIAGNOSIS — J9801 Acute bronchospasm: Secondary | ICD-10-CM

## 2021-05-09 MED ORDER — QVAR REDIHALER 80 MCG/ACT IN AERB: 2.0000 | INHALATION_SPRAY | Freq: Every day | RESPIRATORY_TRACT | 0 refills | Status: DC

## 2021-05-09 MED ORDER — ALBUTEROL SULFATE HFA 108 (90 BASE) MCG/ACT IN AERS: 1.0000 | INHALATION_SPRAY | Freq: Four times a day (QID) | RESPIRATORY_TRACT | 0 refills | Status: DC | PRN

## 2021-05-09 MED ORDER — PREDNISONE 20 MG PO TABS: 40.0000 mg | ORAL_TABLET | Freq: Every day | ORAL | 0 refills | Status: DC

## 2021-05-09 NOTE — Discharge Instructions (Addendum)
-  Albuterol inhaler as needed for cough, wheezing, shortness of breath, 1 to 2 puffs every 6 hours as needed. -Qvar inhaler daily -Prednisone, 2 pills taken at the same time for 5 days in a row.  Try taking this earlier in the day as it can give you energy. Avoid NSAIDs like ibuprofen and alleve while taking this medication as they can increase your risk of stomach upset and even GI bleeding when in combination with a steroid. You can continue tylenol (acetaminophen) up to 1000mg  3x daily. -Follow-up if symptoms getting worse - chest pain, shortness of breath, coughing up brown or red, new fevers, etc.

## 2021-05-09 NOTE — ED Provider Notes (Signed)
[place]    CSN: [id number] Arrival date & time: 05/09/21  1905      History   Chief Complaint Chief Complaint  Patient presents with   Cough    HPI Rita Lynch is a 36 y.o. female presenting with lingering cough following viral syndrome.  Medical history asthma, this is typically controlled on Qvar and albuterol though she is out of these.  Describes 1 week of dry cough with some dyspnea on exertion.  States that her whole household was sick, they are all feeling better but she is only feeling worse.  Denies associated fevers, dizziness, weakness.  States she does have some central chest wall pain with deep inspiration and coughing, but no chest pain at rest.  HPI  Past Medical History:  Diagnosis Date   Allergy    Anxiety    Asthma    COVID    Pregnant [id number]   Umbilical hernia     Patient Active Problem List   Diagnosis Date Noted   Allergy 12/22/2020   Asthma 12/22/2020   Anxiety A999333   Umbilical hernia [id number]   Severe cervical dysplasia, histologically confirmed 08/19/2012    History reviewed. No pertinent surgical history.  OB History     Gravida  4   Para  3   Term  3   Preterm      AB      Living  4      SAB      IAB      Ectopic      Multiple      Live Births  3            Home Medications    Prior to Admission medications   Medication Sig Start Date End Date Taking? Authorizing Provider  albuterol (VENTOLIN HFA) 108 (90 Base) MCG/ACT inhaler Inhale 1-2 puffs into the lungs every 6 (six) hours as needed for wheezing or shortness of breath. 05/09/21  Yes Hazel Sams, PA-C  beclomethasone (QVAR REDIHALER) 80 MCG/ACT inhaler Inhale 2 puffs into the lungs daily. 05/09/21  Yes Hazel Sams, PA-C  predniSONE (DELTASONE) 20 MG tablet Take 2 tablets (40 mg total) by mouth daily for 5 days. Take with breakfast or lunch. Avoid NSAIDs (ibuprofen, etc) while taking this medication. 05/09/21 05/14/21 Yes  Hazel Sams, PA-C  Norethindrone Acetate-Ethinyl Estrad-FE (LOESTRIN 24 FE) 1-20 MG-MCG(24) tablet Take 1 tablet by mouth daily. 12/22/20   Donnamae Jude, MD    Family History Family History  Problem Relation Age of Onset   Healthy Mother    Healthy Father    Diabetes Sister    Diabetes Sister     Social History Social History   Tobacco Use   Smoking status: Never   Smokeless tobacco: Never  Vaping Use   Vaping Use: Never used  Substance Use Topics   Alcohol use: Yes    Comment: occ   Drug use: Never     Allergies   Patient has no known allergies.   Review of Systems Review of Systems  Constitutional:  Negative for appetite change, chills and fever.  HENT:  Positive for congestion. Negative for ear pain, rhinorrhea, sinus pressure, sinus pain and sore throat.   Eyes:  Negative for redness and visual disturbance.  Respiratory:  Positive for cough and shortness of breath. Negative for chest tightness and wheezing.   Cardiovascular:  Negative for chest pain and palpitations.  Gastrointestinal:  Negative for abdominal pain, constipation,  diarrhea, nausea and vomiting.  Genitourinary:  Negative for dysuria, frequency and urgency.  Musculoskeletal:  Negative for myalgias.  Neurological:  Negative for dizziness, weakness and headaches.  Psychiatric/Behavioral:  Negative for confusion.   All other systems reviewed and are negative.   Physical Exam Triage Vital Signs ED Triage Vitals [05/09/21 1922]  Enc Vitals Group     BP (!) 104/45     Pulse Rate 85     Resp 18     Temp 98.5 F (36.9 C)     Temp Source Oral     SpO2 98 %     Weight      Height      Head Circumference      Peak Flow      Pain Score 0     Pain Loc      Pain Edu?      Excl. in GC?    No data found.  Updated Vital Signs BP (!) 104/45 (BP Location: Left Arm)    Pulse 85    Temp 98.5 F (36.9 C) (Oral)    Resp 18    LMP 04/13/2021    SpO2 98%    Breastfeeding No   Visual Acuity Right  Eye Distance:   Left Eye Distance:   Bilateral Distance:    Right Eye Near:   Left Eye Near:    Bilateral Near:     Physical Exam Vitals reviewed.  Constitutional:      General: She is not in acute distress.    Appearance: Normal appearance. She is not ill-appearing.  HENT:     Head: Normocephalic and atraumatic.     Right Ear: Tympanic membrane, ear canal and external ear normal. No tenderness. No middle ear effusion. There is no impacted cerumen. Tympanic membrane is not perforated, erythematous, retracted or bulging.     Left Ear: Tympanic membrane, ear canal and external ear normal. No tenderness.  No middle ear effusion. There is no impacted cerumen. Tympanic membrane is not perforated, erythematous, retracted or bulging.     Nose: Nose normal. No congestion.     Mouth/Throat:     Mouth: Mucous membranes are moist.     Pharynx: Uvula midline. No oropharyngeal exudate or posterior oropharyngeal erythema.  Eyes:     Extraocular Movements: Extraocular movements intact.     Pupils: Pupils are equal, round, and reactive to light.  Cardiovascular:     Rate and Rhythm: Normal rate and regular rhythm.     Heart sounds: Normal heart sounds.  Pulmonary:     Effort: Pulmonary effort is normal.     Breath sounds: Decreased breath sounds present. No wheezing, rhonchi or rales.     Comments: Freq cough Decrease breath sounds throughout  Chest:     Chest wall: Tenderness present.     Comments: TTP anterior chest wall  Abdominal:     Palpations: Abdomen is soft.     Tenderness: There is no abdominal tenderness. There is no guarding or rebound.  Lymphadenopathy:     Cervical: No cervical adenopathy.     Right cervical: No superficial cervical adenopathy.    Left cervical: No superficial cervical adenopathy.  Neurological:     General: No focal deficit present.     Mental Status: She is alert and oriented to person, place, and time.  Psychiatric:        Mood and Affect: Mood normal.         Behavior: Behavior normal.  Thought Content: Thought content normal.        Judgment: Judgment normal.     UC Treatments / Results  Labs (all labs ordered are listed, but only abnormal results are displayed) Labs Reviewed - No data to display  EKG   Radiology No results found.  Procedures Procedures (including critical care time)  Medications Ordered in UC Medications - No data to display  Initial Impression / Assessment and Plan / UC Course  I have reviewed the triage vital signs and the nursing notes.  Pertinent labs & imaging results that were available during my care of the patient were reviewed by me and considered in my medical decision making (see chart for details).     This patient is a very pleasant 36 y.o. year old female presenting with asthma exacerbation following viral syndrome. She is out of her inhaler. Afebrile, nontachy. Chest pain is reproducible. Low concern for pneumonia or bacteremia given normal vitals and clear lung sounds, did not check a CXR which she is in agreement with. Refilled albuterol and qvar and sent low-dose prednisone. States she is not pregnant or breastfeeding. ED return precautions discussed. Patient verbalizes understanding and agreement. Coding Level 4 for acute exacerbation of chronic condition, and prescription drug management.  She did not check a home covid test. Symptoms x7 days, did not check a covid or influenza test.    Final Clinical Impressions(s) / UC Diagnoses   Final diagnoses:  Mild intermittent asthma with exacerbation     Discharge Instructions      -Albuterol inhaler as needed for cough, wheezing, shortness of breath, 1 to 2 puffs every 6 hours as needed. -Qvar inhaler daily -Prednisone, 2 pills taken at the same time for 5 days in a row.  Try taking this earlier in the day as it can give you energy. Avoid NSAIDs like ibuprofen and alleve while taking this medication as they can increase your risk  of stomach upset and even GI bleeding when in combination with a steroid. You can continue tylenol (acetaminophen) up to 1000mg  3x daily. -Follow-up if symptoms getting worse - chest pain, shortness of breath, coughing up brown or red, new fevers, etc.     ED Prescriptions     Medication Sig Dispense Auth. Provider   albuterol (VENTOLIN HFA) 108 (90 Base) MCG/ACT inhaler Inhale 1-2 puffs into the lungs every 6 (six) hours as needed for wheezing or shortness of breath. 1 each Hazel Sams, PA-C   predniSONE (DELTASONE) 20 MG tablet Take 2 tablets (40 mg total) by mouth daily for 5 days. Take with breakfast or lunch. Avoid NSAIDs (ibuprofen, etc) while taking this medication. 10 tablet Hazel Sams, PA-C   beclomethasone (QVAR REDIHALER) 80 MCG/ACT inhaler Inhale 2 puffs into the lungs daily. 1 each Hazel Sams, PA-C      PDMP not reviewed this encounter.   Hazel Sams, PA-C 05/09/21 1939

## 2021-05-09 NOTE — ED Triage Notes (Signed)
Pt c/o cough and chest congestion x1wk. States having SOB, pain on inspiration from chest through to back. Using her inhaler with no relief.

## 2021-05-12 ENCOUNTER — Ambulatory Visit: Payer: Self-pay | Attending: Physician Assistant | Admitting: Physician Assistant

## 2021-05-12 DIAGNOSIS — F410 Panic disorder [episodic paroxysmal anxiety] without agoraphobia: Secondary | ICD-10-CM

## 2021-05-12 DIAGNOSIS — J4520 Mild intermittent asthma, uncomplicated: Secondary | ICD-10-CM

## 2021-05-12 DIAGNOSIS — Z09 Encounter for follow-up examination after completed treatment for conditions other than malignant neoplasm: Secondary | ICD-10-CM

## 2021-05-12 MED ORDER — QVAR REDIHALER 80 MCG/ACT IN AERB: 2.0000 | INHALATION_SPRAY | Freq: Every day | RESPIRATORY_TRACT | 3 refills | Status: DC

## 2021-05-12 MED ORDER — HYDROXYZINE PAMOATE 25 MG PO CAPS
25.0000 mg | ORAL_CAPSULE | Freq: Three times a day (TID) | ORAL | 0 refills | Status: DC | PRN
Start: 2021-05-12 — End: 2023-11-20

## 2021-05-12 MED ORDER — ALBUTEROL SULFATE HFA 108 (90 BASE) MCG/ACT IN AERS: 1.0000 | INHALATION_SPRAY | Freq: Four times a day (QID) | RESPIRATORY_TRACT | 0 refills | Status: DC | PRN

## 2021-05-12 NOTE — Progress Notes (Signed)
Patient ID: Rita Lynch, female   DOB: 1985-08-19, 36 y.o.   MRN: [id number] Virtual Visit via Telephone Note  I connected with Jenness Corner on 05/12/21 at 11:10 AM EST by telephone and verified that I am speaking with the correct person using two identifiers.  Location: Patient: home Provider: my home office   I discussed the limitations, risks, security and privacy concerns of performing an evaluation and management service by telephone and the availability of in person appointments. I also discussed with the patient that there may be a patient responsible charge related to this service. The patient expressed understanding and agreed to proceed.   History of Present Illness: Patient was seen at [facility] 05/09/2021 for wheezing.  She is on the prednisone and inhalers.  Her symptoms are improving  She also c/o experiencing anxiety at times that can be strong and turn into a panic attack.  This occurs about once every few months and she has gone to the ED for it.  No ST changes on EKG.    From UC note  This patient is a very pleasant 36 y.o. year old female presenting with asthma exacerbation following viral syndrome. She is out of her inhaler. Afebrile, nontachy. Chest pain is reproducible. Low concern for pneumonia or bacteremia given normal vitals and clear lung sounds, did not check a CXR which she is in agreement with. Refilled albuterol and qvar and sent low-dose prednisone. States she is not pregnant or breastfeeding. ED return precautions discussed. Patient verbalizes understanding and agreement. Coding Level 4 for acute exacerbation of chronic condition, and prescription drug management.   She did not check a home covid test. Symptoms x7 days, did not check a covid or influenza test.    Observations/Objective:  NAD.  A&Ox3   Assessment and Plan: 1. Intermittent asthma without complication, unspecified asthma severity  - beclomethasone (QVAR REDIHALER) 80 MCG/ACT inhaler;  Inhale 2 puffs into the lungs daily.  Dispense: 1 each; Refill: 3 - albuterol (VENTOLIN HFA) 108 (90 Base) MCG/ACT inhaler; Inhale 1-2 puffs into the lungs every 6 (six) hours as needed for wheezing or shortness of breath.  Dispense: 1 each; Refill: 0  2. Panic attack We can try this since the panic attacks are not frequent.   - hydrOXYzine (VISTARIL) 25 MG capsule; Take 1 capsule (25 mg total) by mouth every 8 (eight) hours as needed. For anxiety  Dispense: 30 capsule; Refill: 0  3. Encounter for examination following treatment at hospital improving    Follow Up Instructions: See PCP in 4-6 weeks to follow up on anxiety/panic attacks   I discussed the assessment and treatment plan with the patient. The patient was provided an opportunity to ask questions and all were answered. The patient agreed with the plan and demonstrated an understanding of the instructions.   The patient was advised to call back or seek an in-person evaluation if the symptoms worsen or if the condition fails to improve as anticipated.  I provided 17 minutes of non-face-to-face time during this encounter.   Freeman Caldron, PA-C

## 2021-06-07 ENCOUNTER — Other Ambulatory Visit: Payer: Self-pay | Admitting: Physician Assistant

## 2021-06-07 DIAGNOSIS — J4520 Mild intermittent asthma, uncomplicated: Secondary | ICD-10-CM

## 2021-06-07 NOTE — Telephone Encounter (Signed)
Requested Prescriptions  Pending Prescriptions Disp Refills   albuterol (VENTOLIN HFA) 108 (90 Base) MCG/ACT inhaler [Pharmacy Med Name: ALBUTEROL HFA INH (200 PUFFS) 6.7GM] 6.7 g 0    Sig: INHALE 1 TO 2 PUFFS INTO THE LUNGS EVERY 6 HOURS AS NEEDED FOR WHEEZING OR SHORTNESS OF BREATH     Pulmonology:  [facility] 2 Failed - 06/07/2021  3:30 AM      Failed - Last BP in normal range    BP Readings from Last 1 Encounters:  05/09/21 (!) 104/45         Passed - Last Heart Rate in normal range    Pulse Readings from Last 1 Encounters:  05/09/21 85         Passed - Valid encounter within last 12 months    Recent Outpatient Visits          3 weeks ago Intermittent asthma without complication, unspecified asthma severity   [facility] [place], [place], [place]   1 year ago Dyspnea and respiratory abnormalities   [facility] [place], Valetta Mole, [place]   1 year ago Bronchospasm   [facility] [place], Odette Horns, MD   1 year ago COVID   [facility] [place], Valetta Mole, [place]   1 year ago Annual physical exam   [facility] Hoy Register, MD      Future Appointments            In 1 month Hoy Register, MD [facility]

## 2021-07-12 ENCOUNTER — Ambulatory Visit: Payer: Self-pay | Attending: Family Medicine | Admitting: Family Medicine

## 2021-07-12 ENCOUNTER — Encounter: Payer: Self-pay | Admitting: Family Medicine

## 2021-07-12 ENCOUNTER — Other Ambulatory Visit: Payer: Self-pay

## 2021-07-12 VITALS — BP 106/72 | HR 73 | Ht 61.0 in | Wt 141.4 lb

## 2021-07-12 DIAGNOSIS — J4520 Mild intermittent asthma, uncomplicated: Secondary | ICD-10-CM

## 2021-07-12 DIAGNOSIS — F419 Anxiety disorder, unspecified: Secondary | ICD-10-CM

## 2021-07-12 MED ORDER — ALBUTEROL SULFATE HFA 108 (90 BASE) MCG/ACT IN AERS: 2.0000 | INHALATION_SPRAY | Freq: Four times a day (QID) | RESPIRATORY_TRACT | 1 refills | Status: DC | PRN

## 2021-07-12 MED ORDER — QVAR REDIHALER 40 MCG/ACT IN AERB: 2.0000 | INHALATION_SPRAY | Freq: Two times a day (BID) | RESPIRATORY_TRACT | 3 refills | Status: DC

## 2021-07-12 NOTE — Progress Notes (Signed)
? ?Subjective:  ?Patient ID: Rita Lynch, female    DOB: 02-02-1986  Age: 36 y.o. MRN: [id number] ? ?CC: Asthma and Panic Attack ? ? ?HPI ?Rita Lynch is a 36 y.o. year old female with a history of asthma who presents today for follow-up visit. ? ?Interval History: ?Seen at the ED in 04/2021 for asthma exacerbation ?It happened after she got off work and took a shower after which she developed palpitations, neck pain, dyspnea, dizziness. ?In 11/2020 she had a similar episode. ?Currently on albuterol MDI and Qvar. ? ?Since using her inhaler she has felt better. ?States she gets dyspneic with exercise and still has had to stop exercising and no longer walks even though she would have love to walk regularly. ?If she uses her Qvar bid her heart races and so she uses it daily. ?She used the albuterol MDI 2 puff as needed prior to incident in January but since then has not needed it. ?She does endorse presence of anxiety and was prescribed hydroxyzine in the past but does not take it because of its sedation. ?Past Medical History:  ?Diagnosis Date  ? Allergy   ? Anxiety   ? Asthma   ? COVID   ? Pregnant 2014  ? Umbilical hernia   ? ? ?No past surgical history on file. ? ?Family History  ?Problem Relation Age of Onset  ? Healthy Mother   ? Healthy Father   ? Diabetes Sister   ? Diabetes Sister   ? ? ?Social History  ? ?Socioeconomic History  ? Marital status: Married  ?  Spouse name: Not on file  ? Number of children: 3  ? Years of education: Not on file  ? Highest education level: 11th grade  ?Occupational History  ? Not on file  ?Tobacco Use  ? Smoking status: Never  ? Smokeless tobacco: Never  ?Vaping Use  ? Vaping Use: Never used  ?Substance and Sexual Activity  ? Alcohol use: Yes  ?  Comment: occ  ? Drug use: Never  ? Sexual activity: Yes  ?  Birth control/protection: None  ?Other Topics Concern  ? Not on file  ?Social History Narrative  ? Not on file  ? ?Social Determinants of Health  ? ?Financial  Resource Strain: Not on file  ?Food Insecurity: Not on file  ?Transportation Needs: Not on file  ?Physical Activity: Not on file  ?Stress: Not on file  ?Social Connections: Not on file  ? ? ?No Known Allergies ? ?Outpatient Medications Prior to Visit  ?Medication Sig Dispense Refill  ? Norethindrone Acetate-Ethinyl Estrad-FE (LOESTRIN 24 FE) 1-20 MG-MCG(24) tablet Take 1 tablet by mouth daily. 84 tablet 3  ? albuterol (VENTOLIN HFA) 108 (90 Base) MCG/ACT inhaler INHALE 1 TO 2 PUFFS INTO THE LUNGS EVERY 6 HOURS AS NEEDED FOR WHEEZING OR SHORTNESS OF BREATH 6.7 g 0  ? beclomethasone (QVAR REDIHALER) 80 MCG/ACT inhaler Inhale 2 puffs into the lungs daily. 1 each 3  ? hydrOXYzine (VISTARIL) 25 MG capsule Take 1 capsule (25 mg total) by mouth every 8 (eight) hours as needed. For anxiety (Patient not taking: Reported on 07/12/2021) 30 capsule 0  ? ?No facility-administered medications prior to visit.  ? ? ? ?ROS ?Review of Systems  ?Constitutional:  Negative for activity change, appetite change and fatigue.  ?HENT:  Negative for congestion, sinus pressure and sore throat.   ?Eyes:  Negative for visual disturbance.  ?Respiratory:  Negative for cough, chest tightness, shortness of breath and wheezing.   ?  Cardiovascular:  Negative for chest pain and palpitations.  ?Gastrointestinal:  Negative for abdominal distention, abdominal pain and constipation.  ?Endocrine: Negative for polydipsia.  ?Genitourinary:  Negative for dysuria and frequency.  ?Musculoskeletal:  Negative for arthralgias and back pain.  ?Skin:  Negative for rash.  ?Neurological:  Negative for tremors, light-headedness and numbness.  ?Hematological:  Does not bruise/bleed easily.  ?Psychiatric/Behavioral:  Negative for agitation and behavioral problems.   ? ?Objective:  ?BP 106/72   Pulse 73   Ht 5\' 1"  (1.549 m)   Wt 141 lb 6.4 oz (64.1 kg)   SpO2 100%   BMI 26.72 kg/m?  ? ? ?  07/12/2021  ?  2:13 PM 05/09/2021  ?  7:22 PM 12/22/2020  ?  3:08 PM  ?BP/Weight   ?Systolic BP A999333 [id number]  ?Diastolic BP 72 45 62  ?Wt. (Lbs) 141.4    ?BMI 26.72 kg/m2    ? ? ? ? ?Physical Exam ?Constitutional:   ?   Appearance: She is well-developed.  ?Cardiovascular:  ?   Rate and Rhythm: Normal rate.  ?   Heart sounds: Normal heart sounds. No murmur heard. ?Pulmonary:  ?   Effort: Pulmonary effort is normal.  ?   Breath sounds: Normal breath sounds. No wheezing or rales.  ?Chest:  ?   Chest wall: No tenderness.  ?Abdominal:  ?   General: Bowel sounds are normal. There is no distension.  ?   Palpations: Abdomen is soft. There is no mass.  ?   Tenderness: There is no abdominal tenderness.  ?Musculoskeletal:     ?   General: Normal range of motion.  ?   Right lower leg: No edema.  ?   Left lower leg: No edema.  ?Neurological:  ?   Mental Status: She is alert and oriented to person, place, and time.  ?Psychiatric:     ?   Mood and Affect: Mood normal.  ? ? ? ?  Latest Ref Rng & Units 12/11/2020  ?  6:12 AM 12/24/2019  ? 11:47 AM 05/30/2018  ?  5:18 PM  ?CMP  ?Glucose 70 - 99 mg/dL 98   96   83    ?BUN 6 - 20 mg/dL 10   13   8     ?Creatinine 0.44 - 1.00 mg/dL 0.78   0.80   0.81    ?Sodium 135 - 145 mmol/L 139   138   139    ?Potassium 3.5 - 5.1 mmol/L 3.4   3.5   4.0    ?Chloride 98 - 111 mmol/L 105   103   107    ?CO2 22 - 32 mmol/L 24   24   26     ?Calcium 8.9 - 10.3 mg/dL 9.3   9.2   9.4    ? ? ?Lipid Panel  ?   ?Component Value Date/Time  ? CHOL 173 12/29/2019 1123  ? TRIG 70 12/29/2019 1123  ? HDL 66 12/29/2019 1123  ? CHOLHDL 2.6 12/29/2019 1123  ? [place] 94 12/29/2019 1123  ? ? ?CBC ?   ?Component Value Date/Time  ? WBC 9.5 12/11/2020 0612  ? RBC 4.33 12/11/2020 0612  ? HGB 12.4 12/11/2020 0612  ? HCT 37.4 12/11/2020 0612  ? PLT 211 12/11/2020 0612  ? MCV 86.4 12/11/2020 0612  ? MCH 28.6 12/11/2020 0612  ? MCHC 33.2 12/11/2020 0612  ? RDW 13.7 12/11/2020 0612  ? LYMPHSABS 1.5 04/05/2018 1719  ? MONOABS 0.7 04/05/2018 1719  ?  EOSABS 0.1 04/05/2018 1719  ? BASOSABS 0.0 04/05/2018 1719  ? ? ?No  results found for: HGBA1C ? ?Assessment & Plan:  ?1. Intermittent asthma without complication, unspecified asthma severity ?Controlled with no exacerbations but unfortunately she is unable to exercise ?She is of the opinion that her ICS might be causing palpitations and tachycardia hence I have decreased her Qvar from 80 mcg to 40 mcg and she has been advised to inhale this twice daily.  It is surprising because I would expect tachycardia with albuterol and not with her ICS. ?Counseled to use albuterol MDI prior to going for walks or exercising ?If she still has tachycardia with Qvar at her next visit I will switch to another ICS. ?- beclomethasone (QVAR REDIHALER) 40 MCG/ACT inhaler; Inhale 2 puffs into the lungs 2 (two) times daily.  Dispense: 1 each; Refill: 3 ?- albuterol (VENTOLIN HFA) 108 (90 Base) MCG/ACT inhaler; Inhale 2 puffs into the lungs every 6 (six) hours as needed for wheezing or shortness of breath.  Dispense: 6.7 g; Refill: 1 ? ?2. Anxiety ?This could be playing a role in her asthma symptoms ?She does have hydroxyzine which she has been advised to use as needed ?Anxiety symptoms occur sparingly hence no indication for SSRI ? ? ? ?Meds ordered this encounter  ?Medications  ? beclomethasone (QVAR REDIHALER) 40 MCG/ACT inhaler  ?  Sig: Inhale 2 puffs into the lungs 2 (two) times daily.  ?  Dispense:  1 each  ?  Refill:  3  ? albuterol (VENTOLIN HFA) 108 (90 Base) MCG/ACT inhaler  ?  Sig: Inhale 2 puffs into the lungs every 6 (six) hours as needed for wheezing or shortness of breath.  ?  Dispense:  6.7 g  ?  Refill:  1  ? ? ?Follow-up: Return in about 6 weeks (around 08/23/2021) for Chronic medical conditions.  ? ? ? ? ? ?Charlott Rakes, MD, FAAFP. ?[place] ?[place], [place] ?[phone]   ?07/12/2021, 2:45 PM ?

## 2021-07-12 NOTE — Patient Instructions (Signed)
Asthma Action Plan, Adult An asthma action plan helps you understand how to manage your asthma and what to do when you have an asthma attack. The action plan is a color-coded plan that lists the symptoms that indicate whether or not your condition is under control and what actions to take. If you have symptoms in the green zone, you are doing well. If you have symptoms in the yellow zone, you are having problems. If you have symptoms in the red zone, you need medical care right away. Follow the plan that you and your health care provider develop. Review your plan with your health care provider at each visit. What triggers your asthma? Knowing the things that can trigger an asthma attack or make your asthma symptoms worse is very important. Talk to your health care provider about your asthma triggers and how to avoid them. Record your known asthma triggers here: _______________ What is your personal best peak flow reading? If you use a peak flow meter, determine your personal best reading. Record it here: _______________ Green zone This zone means that your asthma is under control. You may not have any symptoms while you are in the green zone. This means that you: Have no coughing or wheezing, even while you are working or playing. Sleep through the night. Are breathing well. Have a peak flow reading that is above __________ (80% of your personal best or greater). If you are in the green zone, continue to manage your asthma as directed. Take these medicines every day: Controller medicine and dosage: _______________ Controller medicine and dosage: _______________ Controller medicine and dosage: _______________ Controller medicine and dosage: _______________ Before exercise, use this reliever or rescue medicine: _______________ Call your health care provider if you are using a reliever or rescue medicine more than 2-3 times a week. Yellow zone Symptoms in this zone mean that your condition may  be getting worse. You may have symptoms that interfere with exercise, are noticeably worse after exposure to triggers, or are worse at the first sign of a cold (upper respiratory infection). These may include: Waking from sleep. Coughing, especially at night or first thing in the morning. Mild wheezing. Chest tightness. A peak flow reading that is __________ to __________ (50-79% of your personal best). If you have any of these symptoms: Add the following medicine to the ones that you use daily: Reliever or rescue medicine and dosage: _______________ Additional medicine and dosage: _______________ Call your health care provider if: You remain in the yellow zone for __________ hours. You are using a reliever or rescue medicine more than 2-3 times a week. Red zone Symptoms in this zone mean that you should get medical help right away. You will likely feel distressed and have symptoms at rest that restrict your activity. You are in the red zone if: You are breathing hard and quickly. Your nose opens wide, your ribs show, and your neck muscles become visible when you breathe in. Your lips, fingers, or toes are a bluish color. You have trouble speaking in full sentences. Your peak flow reading is less than __________ (less than 50% of your personal best). Your symptoms do not improve within 15-20 minutes after you use your reliever or rescue medicine (bronchodilator). If you have any of these symptoms: These symptoms represent a serious problem that is an emergency. Do not wait to see if the symptoms will go away. Get medical help right away. Call your local emergency services (911 in the [place] Do not drive yourself   to the hospital. Use your reliever or rescue medicine. Start a nebulizer treatment or take 2-4 puffs from a metered-dose inhaler with a spacer. Repeat this action every 15-20 minutes until help arrives. Where to find more information You can find more information about asthma  from: Centers for Disease Control and Prevention: www.cdc.gov American Lung Association: www.lung.org This information is not intended to replace advice given to you by your health care provider. Make sure you discuss any questions you have with your health care provider. Document Revised: 05/27/2020 Document Reviewed: 05/27/2020 Elsevier Patient Education  2022 Elsevier Inc.  

## 2021-10-17 ENCOUNTER — Ambulatory Visit: Payer: Self-pay | Admitting: Family Medicine

## 2022-04-03 IMAGING — DX DG CHEST 2V
2 series · 2 of 2 positions shown · non-contrast
Comparison: Chest x-ray 06/21/2018

CLINICAL DATA: Chest pain and shortness of breath

EXAM:
CHEST - 2 VIEW

[chest pa]
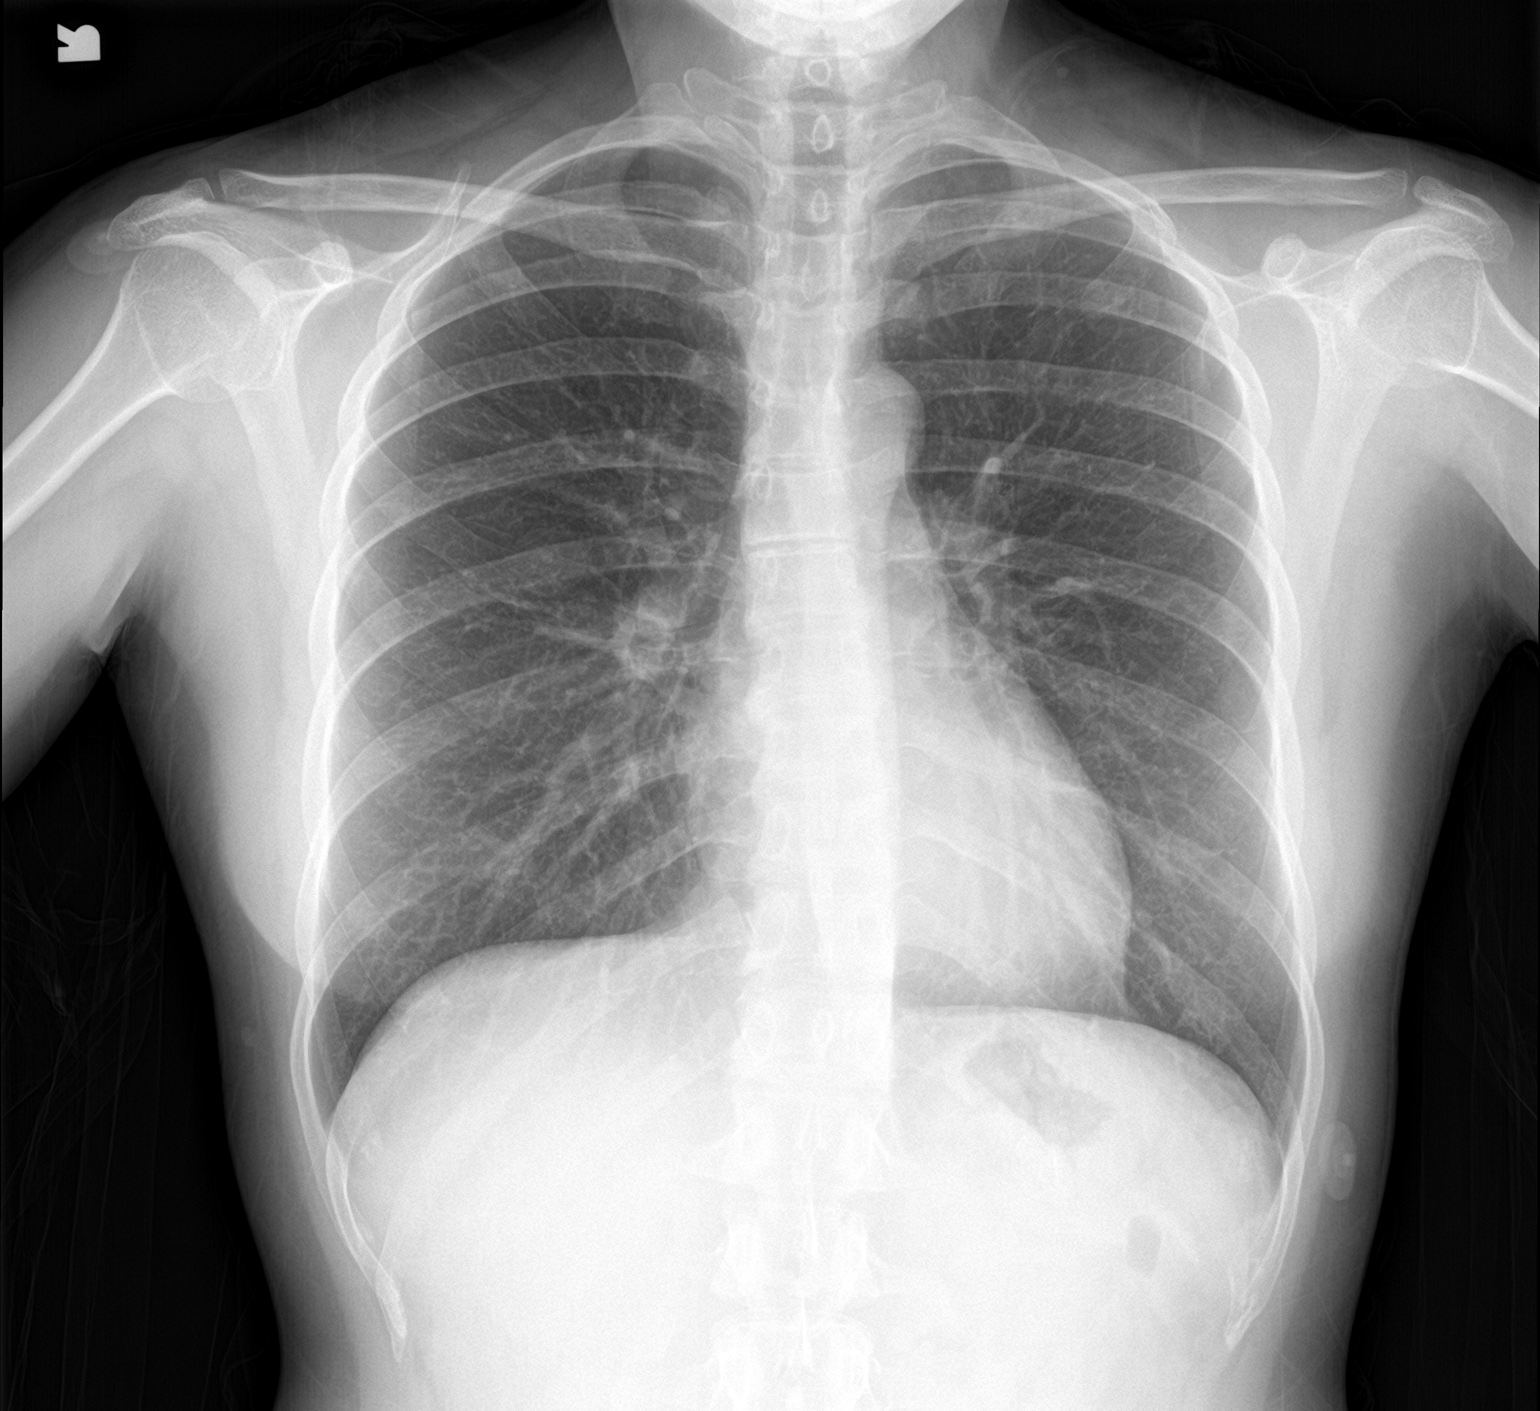

[chest lat]
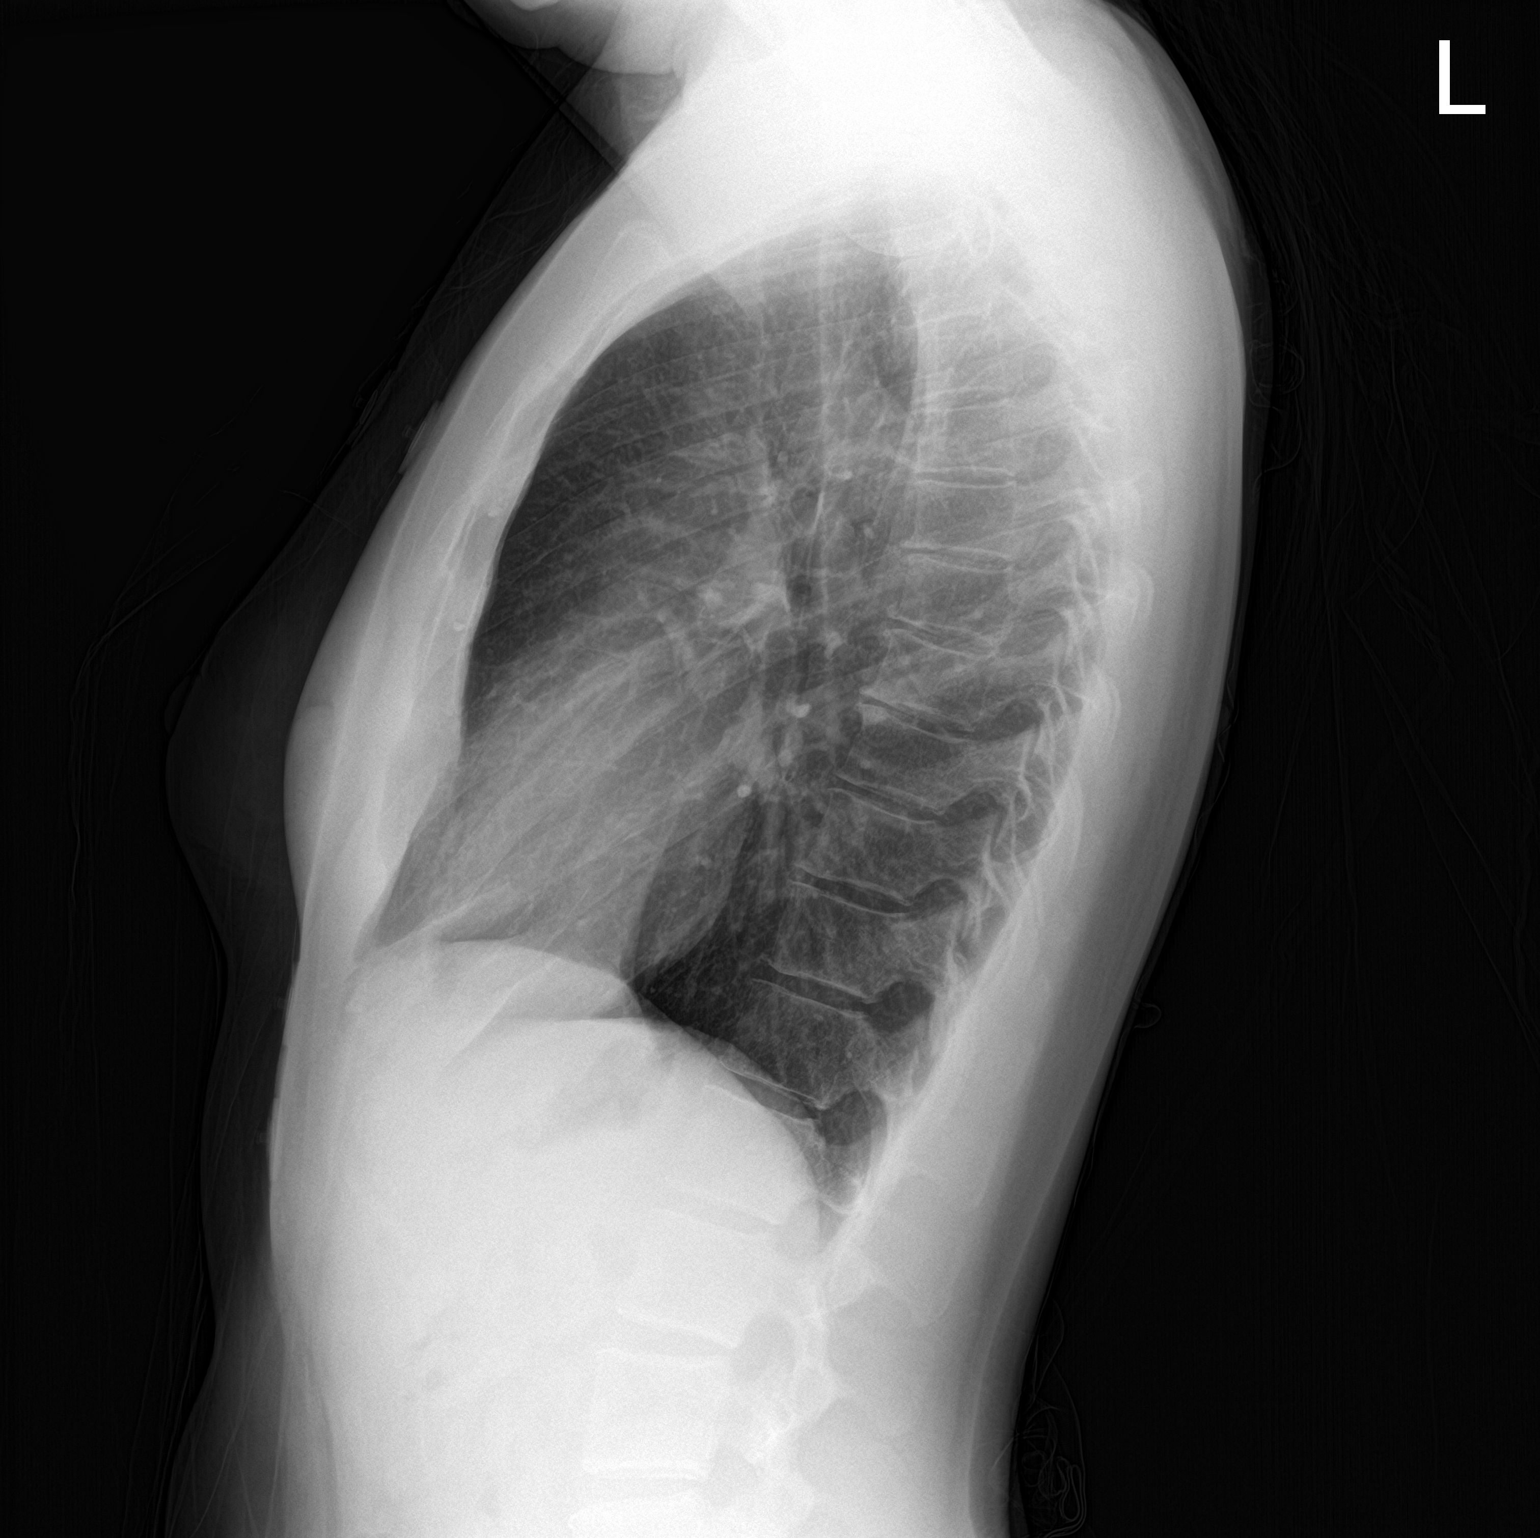

[2 of 2 positions shown; findings below may reference images not displayed]

FINDINGS: The heart size and mediastinal contours are within normal limits.
Both lungs are clear.

No acute osseous abnormality. Mild multilevel degenerative changes
of the spine.
IMPRESSION: No active cardiopulmonary disease.

## 2023-05-17 ENCOUNTER — Ambulatory Visit: Payer: Self-pay | Attending: Internal Medicine | Admitting: Internal Medicine

## 2023-05-17 ENCOUNTER — Other Ambulatory Visit (HOSPITAL_COMMUNITY)
Admission: RE | Admit: 2023-05-17 | Discharge: 2023-05-17 | Disposition: A | Discharge status: Home / Self Care | Payer: Self-pay | Source: Ambulatory Visit | Attending: Internal Medicine | Admitting: Internal Medicine

## 2023-05-17 VITALS — BP 102/69 | HR 67 | Temp 98.1°F | Resp 20 | Ht 61.0 in | Wt 158.6 lb

## 2023-05-17 DIAGNOSIS — N898 Other specified noninflammatory disorders of vagina: Secondary | ICD-10-CM

## 2023-05-17 DIAGNOSIS — J4520 Mild intermittent asthma, uncomplicated: Secondary | ICD-10-CM

## 2023-05-17 MED ORDER — ALBUTEROL SULFATE HFA 108 (90 BASE) MCG/ACT IN AERS: 2.0000 | INHALATION_SPRAY | Freq: Four times a day (QID) | RESPIRATORY_TRACT | 3 refills | Status: AC | PRN

## 2023-05-17 MED ORDER — QVAR REDIHALER 40 MCG/ACT IN AERB: 2.0000 | INHALATION_SPRAY | Freq: Two times a day (BID) | RESPIRATORY_TRACT | 3 refills | Status: AC

## 2023-05-17 NOTE — Progress Notes (Signed)
Patient ID: Rita Lynch, female    DOB: [birth year]  MRN: [id number]  CC: No chief complaint on file.   Subjective: Rita Lynch is a 38 y.o. female who presents for U/C visit Her concerns today include:   Pt reports having a retain tampon in the vagina.  Thinks it was in there for 6 days.  Smelled a strong vaginal odor; put her finger in the vagina and found 1/2 of a tampon which she removed.  This occurred 7 days ago.  Wants to make sure the other 1/2 is in there. No vaginal dischg.   Request RF on Qvar and Albuterol inhalers Patient Active Problem List   Diagnosis Date Noted   Allergy 12/22/2020   Asthma 12/22/2020   Anxiety 12/22/2020   Umbilical hernia 12/22/2020   Severe cervical dysplasia, histologically confirmed 08/19/2012     Current Outpatient Medications on File Prior to Visit  Medication Sig Dispense Refill   hydrOXYzine (VISTARIL) 25 MG capsule Take 1 capsule (25 mg total) by mouth every 8 (eight) hours as needed. For anxiety (Patient not taking: Reported on 05/17/2023) 30 capsule 0   Norethindrone Acetate-Ethinyl Estrad-FE (LOESTRIN 24 FE) 1-20 MG-MCG(24) tablet Take 1 tablet by mouth daily. (Patient not taking: Reported on 05/17/2023) 84 tablet 3   No current facility-administered medications on file prior to visit.    No Known Allergies  Social History   Socioeconomic History   Marital status: Married    Spouse name: Not on file   Number of children: 3   Years of education: Not on file   Highest education level: GED or equivalent  Occupational History   Not on file  Tobacco Use   Smoking status: Never   Smokeless tobacco: Never  Vaping Use   Vaping status: Never Used  Substance and Sexual Activity   Alcohol use: Yes    Comment: occ   Drug use: Never   Sexual activity: Yes    Birth control/protection: None  Other Topics Concern   Not on file  Social History Narrative   Not on file   Social Drivers of Health   Financial  Resource Strain: Medium Risk (05/17/2023)   Overall Financial Resource Strain (CARDIA)    Difficulty of Paying Living Expenses: Somewhat hard  Food Insecurity: No Food Insecurity (05/17/2023)   Hunger Vital Sign    Worried About Running Out of Food in the Last Year: Never true    Ran Out of Food in the Last Year: Never true  Transportation Needs: No Transportation Needs (05/17/2023)   PRAPARE - Administrator, Civil Service (Medical): No    Lack of Transportation (Non-Medical): No  Physical Activity: Insufficiently Active (05/17/2023)   Exercise Vital Sign    Days of Exercise per Week: 3 days    Minutes of Exercise per Session: 30 min  Stress: No Stress Concern Present (05/17/2023)   [employer] of Occupational Health - Occupational Stress Questionnaire    Feeling of Stress : Only a little  Social Connections: Moderately Isolated (05/17/2023)   Social Connection and Isolation Panel [NHANES]    Frequency of Communication with Friends and Family: More than three times a week    Frequency of Social Gatherings with Friends and Family: Once a week    Attends Religious Services: More than 4 times per year    Active Member of [employer] or Organizations: No    Attends Banker Meetings: Not on file    Marital Status: Separated  Intimate Partner Violence: Not on file    Family History  Problem Relation Age of Onset   Healthy Mother    Healthy Father    Diabetes Sister    Diabetes Sister     No past surgical history on file.  ROS: Review of Systems Negative except as stated above  PHYSICAL EXAM: BP 102/69 (BP Location: Right Arm, Patient Position: Sitting, Cuff Size: Normal)   Pulse 67   Temp 98.1 F (36.7 C) (Oral)   Resp 20   Ht 5\' 1"  (1.549 m)   Wt 158 lb 9.6 oz (71.9 kg)   LMP 04/28/2023 (Exact Date)   SpO2 100%   BMI 29.97 kg/m   Physical Exam  General appearance - alert, well appearing, and in no distress Mental status - normal mood, behavior,  speech, dress, motor activity, and thought processes Pelvic - CMA [place] present:  no foreign objects seen in vagina. Moderate thin white discgh in vaginal vault.  Cervix is normal in appearance      Latest Ref Rng & Units 12/11/2020    6:12 AM 12/24/2019   11:47 AM 05/30/2018    5:18 PM  CMP  Glucose 70 - 99 mg/dL 98  96  83   BUN 6 - 20 mg/dL 10  13  8    Creatinine 0.44 - 1.00 mg/dL 1.61  0.96  0.45   Sodium 135 - 145 mmol/L 139  138  139   Potassium 3.5 - 5.1 mmol/L 3.4  3.5  4.0   Chloride 98 - 111 mmol/L 105  103  107   CO2 22 - 32 mmol/L 24  24  26    Calcium 8.9 - 10.3 mg/dL 9.3  9.2  9.4    Lipid Panel     Component Value Date/Time   CHOL 173 12/29/2019 1123   TRIG 70 12/29/2019 1123   HDL 66 12/29/2019 1123   CHOLHDL 2.6 12/29/2019 1123   LDLCALC 94 12/29/2019 1123    CBC    Component Value Date/Time   WBC 9.5 12/11/2020 0612   RBC 4.33 12/11/2020 0612   HGB 12.4 12/11/2020 0612   HCT 37.4 12/11/2020 0612   PLT 211 12/11/2020 0612   MCV 86.4 12/11/2020 0612   MCH 28.6 12/11/2020 0612   MCHC 33.2 12/11/2020 0612   RDW 13.7 12/11/2020 0612   LYMPHSABS 1.5 04/05/2018 1719   MONOABS 0.7 04/05/2018 1719   EOSABS 0.1 04/05/2018 1719   BASOSABS 0.0 04/05/2018 1719    ASSESSMENT AND PLAN: 1. Vaginal odor (Primary) Informed patient that no retained feminine products seen in the vaginal vault.  We will send to check for yeast and BV.  She declines screening for chlamydia/gonorrhea. - Cervicovaginal ancillary only  2. Intermittent asthma without complication, unspecified asthma severity - albuterol (VENTOLIN HFA) 108 (90 Base) MCG/ACT inhaler; Inhale 2 puffs into the lungs every 6 (six) hours as needed for wheezing or shortness of breath.  Dispense: 6.7 g; Refill: 3 - beclomethasone (QVAR REDIHALER) 40 MCG/ACT inhaler; Inhale 2 puffs into the lungs 2 (two) times daily.  Dispense: 1 each; Refill: 3     Patient was given the opportunity to ask questions.   Patient verbalized understanding of the plan and was able to repeat key elements of the plan.   This documentation was completed using Paediatric nurse.  Any transcriptional errors are unintentional.  No orders of the defined types were placed in this encounter.    Requested Prescriptions  Signed Prescriptions Disp Refills   albuterol (VENTOLIN HFA) 108 (90 Base) MCG/ACT inhaler 6.7 g 3    Sig: Inhale 2 puffs into the lungs every 6 (six) hours as needed for wheezing or shortness of breath.   beclomethasone (QVAR REDIHALER) 40 MCG/ACT inhaler 1 each 3    Sig: Inhale 2 puffs into the lungs 2 (two) times daily.    No follow-ups on file.  Jonah Blue, MD, FACP

## 2023-05-18 LAB — CERVICOVAGINAL ANCILLARY ONLY
Bacterial Vaginitis (gardnerella): POSITIVE — AB
Candida Glabrata: NEGATIVE
Candida Vaginitis: POSITIVE — AB
Comment: NEGATIVE
Comment: NEGATIVE
Comment: NEGATIVE

## 2023-05-19 ENCOUNTER — Other Ambulatory Visit: Payer: Self-pay | Admitting: Internal Medicine

## 2023-05-19 MED ORDER — FLUCONAZOLE 150 MG PO TABS: 150.0000 mg | ORAL_TABLET | Freq: Every day | ORAL | 0 refills | Status: DC

## 2023-05-19 MED ORDER — METRONIDAZOLE 500 MG PO TABS: 500.0000 mg | ORAL_TABLET | Freq: Two times a day (BID) | ORAL | 0 refills | Status: DC

## 2023-05-22 ENCOUNTER — Telehealth: Payer: Self-pay | Admitting: Family Medicine

## 2023-05-22 NOTE — Telephone Encounter (Signed)
Copied from CRM [id number]. Topic: Clinical - Lab/Test Results >> May 21, 2023  5:39 PM Alvino Blood C wrote: Reason for CRM: Patient is requesting a call back to discuss lab results [phone]

## 2023-05-23 NOTE — Telephone Encounter (Signed)
 Call placed to patient unable to reach message left on VM.   Call  to discuss labs and question.

## 2023-11-08 ENCOUNTER — Encounter (HOSPITAL_BASED_OUTPATIENT_CLINIC_OR_DEPARTMENT_OTHER): Payer: Self-pay

## 2023-11-08 ENCOUNTER — Emergency Department (HOSPITAL_BASED_OUTPATIENT_CLINIC_OR_DEPARTMENT_OTHER)
Admission: EM | Admit: 2023-11-08 | Discharge: 2023-11-08 | Disposition: A | Discharge status: Home / Self Care | Payer: Self-pay | Attending: Emergency Medicine | Admitting: Emergency Medicine

## 2023-11-08 ENCOUNTER — Other Ambulatory Visit: Payer: Self-pay

## 2023-11-08 ENCOUNTER — Emergency Department (HOSPITAL_BASED_OUTPATIENT_CLINIC_OR_DEPARTMENT_OTHER): Payer: Self-pay

## 2023-11-08 DIAGNOSIS — R079 Chest pain, unspecified: Secondary | ICD-10-CM

## 2023-11-08 DIAGNOSIS — J4520 Mild intermittent asthma, uncomplicated: Secondary | ICD-10-CM | POA: Insufficient documentation

## 2023-11-08 LAB — CBC
HCT: 39.3 % (ref 36.0–46.0)
Hemoglobin: 13 g/dL (ref 12.0–15.0)
MCH: 28.6 pg (ref 26.0–34.0)
MCHC: 33.1 g/dL (ref 30.0–36.0)
MCV: 86.4 fL (ref 80.0–100.0)
Platelets: 223 K/uL (ref 150–400)
RBC: 4.55 MIL/uL (ref 3.87–5.11)
RDW: 13.8 % (ref 11.5–15.5)
WBC: 8.2 K/uL (ref 4.0–10.5)
nRBC: 0 % (ref 0.0–0.2)

## 2023-11-08 LAB — BASIC METABOLIC PANEL WITH GFR
Anion gap: 13 (ref 5–15)
BUN: 15 mg/dL (ref 6–20)
CO2: 24 mmol/L (ref 22–32)
Calcium: 9.5 mg/dL (ref 8.9–10.3)
Chloride: 107 mmol/L (ref 98–111)
Creatinine, Ser: 0.88 mg/dL (ref 0.44–1.00)
GFR, Estimated: >60 mL/min (ref >60)
Glucose, Bld: 95 mg/dL (ref 70–99)
Potassium: 3.9 mmol/L (ref 3.5–5.1)
Sodium: 143 mmol/L (ref 135–145)

## 2023-11-08 LAB — TROPONIN T, HIGH SENSITIVITY: Troponin T High Sensitivity: <15 ng/L (ref <19)

## 2023-11-08 LAB — PREGNANCY, URINE: Preg Test, Ur: NEGATIVE

## 2023-11-08 MED ORDER — AEROCHAMBER PLUS FLO-VU MEDIUM MISC
1.0000 | Freq: Once | Status: AC
  Administered 2023-11-08: 1
  Filled 2023-11-08: qty 1

## 2023-11-08 MED ORDER — ALBUTEROL SULFATE HFA 108 (90 BASE) MCG/ACT IN AERS
2.0000 | INHALATION_SPRAY | Freq: Once | RESPIRATORY_TRACT | Status: AC
  Administered 2023-11-08: 2 via RESPIRATORY_TRACT
  Filled 2023-11-08: qty 6.7

## 2023-11-08 NOTE — Discharge Instructions (Signed)
 You were seen in the emerged from for chest pain Your blood work EKG and chest x-ray all looked okay There is no evidence of heart attack at this time This may be mild flare of your asthma For this we gave you an albuterol  inhaler to go home with Please follow-up with your primary care doctor for within 1 week for reevaluation Return to the Emergency Department for recurrent chest pain trouble breathing or any other concerns

## 2023-11-08 NOTE — ED Provider Notes (Signed)
 [facility] Provider Note   CSN: [id number] Arrival date & time: 11/08/23  1932     Patient presents with: Chest Pain   Rita Lynch is a 38 y.o. female.  With a history of asthma who presents to the ED given concern for chest pain.  3 days of left-sided chest pain and associated shortness of breath made worse with exertion.  She also notes recent increase urticarial rashes that come and go.  Has not had albuterol  at home in the last couple months.  Dry cough.  No fevers chills abdominal pain nausea or vomiting    Chest Pain      Prior to Admission medications   Medication Sig Start Date End Date Taking? Authorizing Provider  albuterol  (VENTOLIN  HFA) 108 (90 Base) MCG/ACT inhaler Inhale 2 puffs into the lungs every 6 (six) hours as needed for wheezing or shortness of breath. 05/17/23   Vicci Barnie NOVAK, MD  beclomethasone (QVAR  REDIHALER) 40 MCG/ACT inhaler Inhale 2 puffs into the lungs 2 (two) times daily. 05/17/23   Vicci Barnie NOVAK, MD  fluconazole  (DIFLUCAN ) 150 MG tablet Take 1 tablet (150 mg total) by mouth daily. 05/19/23   Vicci Barnie NOVAK, MD  hydrOXYzine  (VISTARIL ) 25 MG capsule Take 1 capsule (25 mg total) by mouth every 8 (eight) hours as needed. For anxiety Patient not taking: Reported on 05/17/2023 05/12/21   [place]anton Jon HERO, PA-C  metroNIDAZOLE  (FLAGYL ) 500 MG tablet Take 1 tablet (500 mg total) by mouth 2 (two) times daily. 05/19/23   Vicci Barnie NOVAK, MD  Norethindrone Acetate-Ethinyl Estrad-FE (LOESTRIN 24 FE) 1-20 MG-MCG(24) tablet Take 1 tablet by mouth daily. Patient not taking: Reported on 05/17/2023 12/22/20   Fredirick Glenys RAMAN, MD    Allergies: Patient has no known allergies.    Review of Systems  Cardiovascular:  Positive for chest pain.    Updated Vital Signs BP 117/74   Pulse 67   Temp 98 F (36.7 C)   Resp 18   Ht 5' 1 (1.549 m)   Wt 69.4 kg   LMP 11/08/2023 (Exact Date)   SpO2 97%   BMI  28.91 kg/m   Physical Exam Vitals and nursing note reviewed.  HENT:     Head: Normocephalic and atraumatic.  Eyes:     Pupils: Pupils are equal, round, and reactive to light.  Cardiovascular:     Rate and Rhythm: Normal rate and regular rhythm.  Pulmonary:     Effort: Pulmonary effort is normal.     Breath sounds: Normal breath sounds.  Abdominal:     Palpations: Abdomen is soft.     Tenderness: There is no abdominal tenderness.  Skin:    General: Skin is warm and dry.  Neurological:     Mental Status: She is alert.  Psychiatric:        Mood and Affect: Mood normal.     (all labs ordered are listed, but only abnormal results are displayed) Labs Reviewed  BASIC METABOLIC PANEL WITH GFR  CBC  PREGNANCY, URINE  TROPONIN T, HIGH SENSITIVITY    EKG: EKG Interpretation Date/Time:  Thursday November 08 2023 19:38:27 EDT Ventricular Rate:  73 PR Interval:  148 QRS Duration:  101 QT Interval:  381 QTC Calculation: 420 R Axis:   80  Text Interpretation: Sinus rhythm Low voltage, precordial leads Confirmed by Pamella Sharper [phone]) on 11/08/2023 10:53:19 PM  Radiology: [facility] Chest 2 View Result Date: 11/08/2023 CLINICAL DATA:  Left chest pain for the past 3 days. Some shortness of breath. Asthma. EXAM: CHEST - 2 VIEW COMPARISON:  12/11/2020 FINDINGS: Normal-sized heart. Clear lungs with normal vascularity. Mild scoliosis and minimal thoracic spine degenerative changes. IMPRESSION: No acute abnormality. Electronically Signed   By: Elspeth Bathe M.D.   On: 11/08/2023 20:00     Procedures   Medications Ordered in the ED  albuterol  (VENTOLIN  HFA) 108 (90 Base) MCG/ACT inhaler 2 puff (has no administration in time range)  AeroChamber Plus Flo-Vu Medium MISC 1 each (has no administration in time range)                                    Medical Decision Making 38 year old female with history of mild asthma presenting to the ED for chest pain shortness of breath x 3 days.   Afebrile normotensive well-appearing.  Stable on room air.  No wheezing on my exam.  Benign physical exam otherwise.  No evidence of dysrhythmia on EKG.  High sensitive troponin not consistent with ACS.  PERC negative.  Low suspicion for PE.  No focal consolidation or evidence of pneumonia.  CBC metabolic panel completely within normal limits.  Suspect this may be a mild asthma exacerbation.  Will discharge with albuterol  MDI instruction for PCP follow-up.  Amount and/or Complexity of Data Reviewed Labs: ordered. Radiology: ordered.  Risk Prescription drug management.        Final diagnoses:  Chest pain, unspecified type  Mild intermittent asthma without complication    ED Discharge Orders     None          Pamella Ozell LABOR, DO 11/08/23 2310

## 2023-11-08 NOTE — ED Triage Notes (Signed)
 PT states she has been sweaty, dizzy, nausea & having pressure in her chest x3 days. Pt reports she has been using rubbing alcohol around her shoulders to make it a little better. Pt states movement makes the symptoms worse, nothing really helps.

## 2023-11-12 ENCOUNTER — Ambulatory Visit: Payer: Self-pay

## 2023-11-12 NOTE — Telephone Encounter (Signed)
 FYI Only or Action Required?: FYI only for provider.  Patient was last seen in primary care on 05/17/2023 by Vicci Barnie NOVAK, MD.  Called Nurse Triage reporting No chief complaint on file..  Symptoms began a week ago.  Interventions attempted: Nothing.  Symptoms are: unchanged.  Triage Disposition: See Physician Within 7-8 days  Patient/caregiver understands and will follow disposition?: Yes    Copied from CRM [id number]. Topic: Clinical - Red Word Triage >> Nov 12, 2023 12:16 PM Tiffany B wrote: Red Word that prompted transfer to Nurse Triage: Patient states she was seen at the hospital for chest pain and dizziness but they were unable to find anything advised patient to follow up with PCP. Patient is experiencing  chest pain, dizziness and direfully breathing Reason for Disposition  [1] Chest pain lasts > 5 minutes AND [2] occurred > 3 days ago (72 hours) AND [3] NO chest pain or cardiac symptoms now  Answer Assessment - Initial Assessment Questions 1. LOCATION: Where does it hurt?       Midsternal 2. RADIATION: Does the pain go anywhere else? (e.g., into neck, jaw, arms, back)     Left hand and left side of beck 3. ONSET: When did the chest pain begin? (Minutes, hours or days)      Last Sunday 4. PATTERN: Does the pain come and go, or has it been constant since it started?  Does it get worse with exertion?      Comes and goes 5. DURATION: How long does it last (e.g., seconds, minutes, hours)     A few hours 6. SEVERITY: How bad is the pain?  (e.g., Scale 1-10; mild, moderate, or severe)     mild 7. CARDIAC RISK FACTORS: Do you have any history of heart problems or risk factors for heart disease? (e.g., angina, prior heart attack; diabetes, high blood pressure, high cholesterol, smoker, or strong family history of heart disease)     no 8. PULMONARY RISK FACTORS: Do you have any history of lung disease?  (e.g., blood clots in lung, asthma, emphysema, birth  control pills)     asthma 9. CAUSE: What do you think is causing the chest pain?     unknown 10. OTHER SYMPTOMS: Do you have any other symptoms? (e.g., dizziness, nausea, vomiting, sweating, fever, difficulty breathing, cough)       Nausea, dizziness, sweating 11. PREGNANCY: Is there any chance you are pregnant? When was your last menstrual period?       na  Protocols used: Chest Pain-A-AH

## 2023-11-13 NOTE — Telephone Encounter (Signed)
 Noted

## 2023-11-20 ENCOUNTER — Encounter: Payer: Self-pay | Admitting: Family Medicine

## 2023-11-20 ENCOUNTER — Ambulatory Visit (INDEPENDENT_AMBULATORY_CARE_PROVIDER_SITE_OTHER): Payer: Self-pay | Admitting: Family Medicine

## 2023-11-20 VITALS — BP 109/74 | HR 67 | Ht 61.0 in | Wt 154.4 lb

## 2023-11-20 DIAGNOSIS — F411 Generalized anxiety disorder: Secondary | ICD-10-CM

## 2023-11-20 DIAGNOSIS — R0789 Other chest pain: Secondary | ICD-10-CM

## 2023-11-20 DIAGNOSIS — F410 Panic disorder [episodic paroxysmal anxiety] without agoraphobia: Secondary | ICD-10-CM

## 2023-11-20 MED ORDER — HYDROXYZINE PAMOATE 25 MG PO CAPS: 25.0000 mg | ORAL_CAPSULE | Freq: Three times a day (TID) | ORAL | 1 refills | Status: DC | PRN

## 2023-11-20 NOTE — Progress Notes (Unsigned)
 Established Patient Office Visit  Subjective    Patient ID: Rita Lynch, female    DOB: [birth year]  Age: 38 y.o. MRN: [id number]  CC:  Chief Complaint  Patient presents with   Hospitalization Follow-up    Hospital follow up dx: chest pain.  Pt reports not currently experiencing chest pain.     HPI Rita Lynch presents for ED follow up where she was seen for chest pain. Her symptoms were accompanied by some SOB. She does have a past history of asthma but denies fever/chills, wheezes or viral sx.  Her workup was unremarkable and she no longer has the symptoms. She has been having fatigue and reports increased social stressors.  Outpatient Encounter Medications as of 11/20/2023  Medication Sig   albuterol  (VENTOLIN  HFA) 108 (90 Base) MCG/ACT inhaler Inhale 2 puffs into the lungs every 6 (six) hours as needed for wheezing or shortness of breath.   beclomethasone (QVAR  REDIHALER) 40 MCG/ACT inhaler Inhale 2 puffs into the lungs 2 (two) times daily. (Patient not taking: Reported on 11/20/2023)   fluconazole  (DIFLUCAN ) 150 MG tablet Take 1 tablet (150 mg total) by mouth daily. (Patient not taking: Reported on 11/20/2023)   hydrOXYzine  (VISTARIL ) 25 MG capsule Take 1 capsule (25 mg total) by mouth every 8 (eight) hours as needed. For anxiety (Patient not taking: Reported on 11/20/2023)   metroNIDAZOLE  (FLAGYL ) 500 MG tablet Take 1 tablet (500 mg total) by mouth 2 (two) times daily. (Patient not taking: Reported on 11/20/2023)   Norethindrone Acetate-Ethinyl Estrad-FE (LOESTRIN 24 FE) 1-20 MG-MCG(24) tablet Take 1 tablet by mouth daily. (Patient not taking: Reported on 11/20/2023)   No facility-administered encounter medications on file as of 11/20/2023.    Past Medical History:  Diagnosis Date   Allergy    Anxiety    Asthma    COVID    Pregnant 2014   Umbilical hernia     History reviewed. No pertinent surgical history.  Family History  Problem Relation Age of Onset    Healthy Mother    Healthy Father    Diabetes Sister    Diabetes Sister     Social History   Socioeconomic History   Marital status: Married    Spouse name: Not on file   Number of children: 3   Years of education: Not on file   Highest education level: GED or equivalent  Occupational History   Not on file  Tobacco Use   Smoking status: Never   Smokeless tobacco: Never  Vaping Use   Vaping status: Never Used  Substance and Sexual Activity   Alcohol use: Yes    Comment: occ   Drug use: Never   Sexual activity: Yes    Birth control/protection: None  Other Topics Concern   Not on file  Social History Narrative   Not on file   Social Drivers of Health   Financial Resource Strain: Medium Risk (05/17/2023)   Overall Financial Resource Strain (CARDIA)    Difficulty of Paying Living Expenses: Somewhat hard  Food Insecurity: No Food Insecurity (05/17/2023)   Hunger Vital Sign    Worried About Running Out of Food in the Last Year: Never true    Ran Out of Food in the Last Year: Never true  Transportation Needs: No Transportation Needs (05/17/2023)   PRAPARE - Administrator, Civil Service (Medical): No    Lack of Transportation (Non-Medical): No  Physical Activity: Insufficiently Active (05/17/2023)   Exercise Vital Sign  Days of Exercise per Week: 3 days    Minutes of Exercise per Session: 30 min  Stress: No Stress Concern Present (05/17/2023)   [employer] of Occupational Health - Occupational Stress Questionnaire    Feeling of Stress : Only a little  Social Connections: Moderately Isolated (05/17/2023)   Social Connection and Isolation Panel    Frequency of Communication with Friends and Family: More than three times a week    Frequency of Social Gatherings with Friends and Family: Once a week    Attends Religious Services: More than 4 times per year    Active Member of [employer] or Organizations: No    Attends Engineer, structural: Not on file     Marital Status: Separated  Intimate Partner Violence: Not on file    Review of Systems  All other systems reviewed and are negative.       Objective    BP 109/74   Pulse 67   Ht 5' 1 (1.549 m)   Wt 154 lb 6.4 oz (70 kg)   LMP 11/08/2023 (Exact Date)   SpO2 98%   BMI 29.17 kg/m   Physical Exam Vitals and nursing note reviewed.  Constitutional:      General: She is not in acute distress. Cardiovascular:     Rate and Rhythm: Normal rate and regular rhythm.  Pulmonary:     Effort: Pulmonary effort is normal.     Breath sounds: Normal breath sounds.  Abdominal:     Palpations: Abdomen is soft.     Tenderness: There is no abdominal tenderness.  Neurological:     General: No focal deficit present.     Mental Status: She is alert and oriented to person, place, and time.     {Labs (Optional):23779}    Assessment & Plan:   There are no diagnoses linked to this encounter.   No follow-ups on file.   Tanda Raguel SQUIBB, MD

## 2024-05-22 ENCOUNTER — Ambulatory Visit: Payer: Self-pay

## 2024-05-22 ENCOUNTER — Ambulatory Visit: Payer: Self-pay | Admitting: Internal Medicine

## 2024-05-22 ENCOUNTER — Other Ambulatory Visit: Payer: Self-pay

## 2024-05-22 VITALS — BP 105/70 | HR 70 | Temp 98.3°F | Ht 61.0 in | Wt 157.0 lb

## 2024-05-22 DIAGNOSIS — N938 Other specified abnormal uterine and vaginal bleeding: Secondary | ICD-10-CM

## 2024-05-22 MED ORDER — NORGESTREL-ETHINYL ESTRADIOL 0.3-30 MG-MCG PO TABS: 1.0000 | ORAL_TABLET | Freq: Every day | ORAL | 1 refills | Status: DC

## 2024-05-22 MED ORDER — NORGESTREL-ETHINYL ESTRADIOL 0.3-30 MG-MCG PO TABS
1.0000 | ORAL_TABLET | Freq: Every day | ORAL | 1 refills | Status: AC
  Filled 2024-05-22: qty 28, 28d supply, fill #0

## 2024-05-22 NOTE — Progress Notes (Signed)
 "   Patient ID: Rita Lynch, female    DOB: February 15, 1986  MRN: [id number]  CC: Menstrual Problem (Menstrual cycle happening 2 times monthly X2 mo /Second cycle is light lasting 2 weeks /Reports home pregnancy test is negative /No to flu vax)   Subjective: Rita Lynch is a 39 y.o. female who presents for chronic ds management. Her chronic medical issues include:  Hx asthma, GAD  Discussed the use of AI scribe software for clinical note transcription with the patient, who gave verbal consent to proceed.  History of Present Illness Rita Lynch is a 39 year old female who presents with abnormal menstrual bleeding.  She has experienced irregular menstrual cycles for the past two months. Her normal period began on January 1st and lasted four days. Three days after it ended, she experienced light bleeding throughout January, described as 'pink' and 'very light' spotting, occurring daily without cramps or pain.  On February 2nd, she noticed an increase in bleeding, heavier than the previous spotting but still without cramps. By February 5th, the bleeding became heavier and darker, resembling her regular menstrual cycle, which typically lasts four days with heavy bleeding.  She has not experienced this pattern of bleeding before and has not been using any form of birth control recently. A home pregnancy test on January 15th was negative. Her boyfriend uses condoms, but not consistently. No dizziness related to the bleeding. No history of blood clots in the legs or lungs. Her last Pap smear was up to date, with the next one due in September.    Patient Active Problem List   Diagnosis Date Noted   Allergy 12/22/2020   Asthma 12/22/2020   Anxiety 12/22/2020   Umbilical hernia 12/22/2020   Severe cervical dysplasia, histologically confirmed 08/19/2012     Medications Ordered Prior to Encounter[1]  Allergies[2]  Social History   Socioeconomic History   Marital  status: Married    Spouse name: Not on file   Number of children: 3   Years of education: Not on file   Highest education level: 11th grade  Occupational History   Not on file  Tobacco Use   Smoking status: Never   Smokeless tobacco: Never  Vaping Use   Vaping status: Never Used  Substance and Sexual Activity   Alcohol use: Yes    Comment: occ   Drug use: Never   Sexual activity: Yes    Birth control/protection: None  Other Topics Concern   Not on file  Social History Narrative   Not on file   Social Drivers of Health   Tobacco Use: Low Risk (11/20/2023)   Patient History    Smoking Tobacco Use: Never    Smokeless Tobacco Use: Never    Passive Exposure: Not on file  Financial Resource Strain: Medium Risk (05/22/2024)   Overall Financial Resource Strain (CARDIA)    Difficulty of Paying Living Expenses: Somewhat hard  Food Insecurity: Patient Declined (05/22/2024)   Epic    Worried About Programme Researcher, Broadcasting/film/video in the Last Year: Patient declined    Barista in the Last Year: Patient declined  Transportation Needs: No Transportation Needs (05/22/2024)   Epic    Lack of Transportation (Medical): No    Lack of Transportation (Non-Medical): No  Physical Activity: Insufficiently Active (05/22/2024)   Exercise Vital Sign    Days of Exercise per Week: 2 days    Minutes of Exercise per Session: 20 min  Stress: No Stress Concern  Present (05/22/2024)   [employer] of Occupational Health - Occupational Stress Questionnaire    Feeling of Stress: Only a little  Social Connections: Unknown (05/22/2024)   Social Connection and Isolation Panel    Frequency of Communication with Friends and Family: Once a week    Frequency of Social Gatherings with Friends and Family: Once a week    Attends Religious Services: 1 to 4 times per year    Active Member of [employer] or Organizations: No    Attends Engineer, Structural: Not on file    Marital Status: Patient declined  Intimate Partner  Violence: Not on file  Depression (PHQ2-9): Medium Risk (07/12/2021)   Depression (PHQ2-9)    PHQ-2 Score: 6  Alcohol Screen: Low Risk (05/22/2024)   Alcohol Screen    Last Alcohol Screening Score (AUDIT): 2  Housing: Unknown (05/22/2024)   Epic    Unable to Pay for Housing in the Last Year: No    Number of Times Moved in the Last Year: Not on file    Homeless in the Last Year: No  Utilities: Not on file  Health Literacy: Not on file    Family History  Problem Relation Age of Onset   Healthy Mother    Healthy Father    Diabetes Sister    Diabetes Sister     No past surgical history on file.  ROS: Review of Systems Negative except as stated above  PHYSICAL EXAM: BP 105/70 (BP Location: Left Arm, Patient Position: Sitting, Cuff Size: Normal)   Pulse 70   Temp 98.3 F (36.8 C) (Oral)   Ht 5' 1 (1.549 m)   Wt 157 lb (71.2 kg)   LMP 05/19/2024 (Exact Date)   SpO2 100%   BMI 29.66 kg/m   Physical Exam  General appearance - alert, well appearing, and in no distress Mental status - normal mood, behavior, speech, dress, motor activity, and thought processes Eyes - pink conjunctiva Abdomen - soft, nontender, nondistended, no masses or organomegaly      Latest Ref Rng & Units 11/08/2023    7:42 PM 12/11/2020    6:12 AM 12/24/2019   11:47 AM  CMP  Glucose 70 - 99 mg/dL 95  98  96   BUN 6 - 20 mg/dL 15  10  13    Creatinine 0.44 - 1.00 mg/dL 9.11  9.21  9.19   Sodium 135 - 145 mmol/L 143  139  138   Potassium 3.5 - 5.1 mmol/L 3.9  3.4  3.5   Chloride 98 - 111 mmol/L 107  105  103   CO2 22 - 32 mmol/L 24  24  24    Calcium 8.9 - 10.3 mg/dL 9.5  9.3  9.2    Lipid Panel     Component Value Date/Time   CHOL 173 12/29/2019 1123   TRIG 70 12/29/2019 1123   HDL 66 12/29/2019 1123   CHOLHDL 2.6 12/29/2019 1123   LDLCALC 94 12/29/2019 1123    CBC    Component Value Date/Time   WBC 8.2 11/08/2023 1942   RBC 4.55 11/08/2023 1942   HGB 13.0 11/08/2023 1942   HCT 39.3  11/08/2023 1942   PLT 223 11/08/2023 1942   MCV 86.4 11/08/2023 1942   MCH 28.6 11/08/2023 1942   MCHC 33.1 11/08/2023 1942   RDW 13.8 11/08/2023 1942   LYMPHSABS 1.5 04/05/2018 1719   MONOABS 0.7 04/05/2018 1719   EOSABS 0.1 04/05/2018 1719   BASOSABS 0.0 04/05/2018 1719  ASSESSMENT AND PLAN:  Assessment and Plan Assessment & Plan Dysfunctional uterine bleeding Differential includes hormonal imbalance, fibroids, abnormality of uterine lining or polyps. - Ordered thyroid  hormone level test. - POC urine pregnancy was not able to be done as control line on the test was not showing up so I ordered serum HCG instead.. - Prescribed birth control pills for 1-2 months to regulate bleeding. - Ordered pelvic ultrasound to check for fibroids or polyps. - Advised to apply for orange card or cone discount card for potential gynecology referral. - Instructed to report if bleeding does not stop while on birth control pills. Went over with her how to take BCP. She will start taking if serum pregnancy test is negative. We will call her to let her know tomorrow.   Patient was given the opportunity to ask questions.  Patient verbalized understanding of the plan and was able to repeat key elements of the plan.   This documentation was completed using Paediatric nurse.  Any transcriptional errors are unintentional.  Orders Placed This Encounter  Procedures   US  Pelvic Complete With Transvaginal   CBC   TSH   Beta hCG quant (ref lab)     Requested Prescriptions   Signed Prescriptions Disp Refills   norgestrel -ethinyl estradiol  (LO/OVRAL ) 0.3-30 MG-MCG tablet 28 tablet 1    Sig: Take 1 tablet by mouth daily.    Return in about 2 months (around 07/20/2024).  Barnie Louder, MD, FACP     [1]  Current Outpatient Medications on File Prior to Visit  Medication Sig Dispense Refill   albuterol  (VENTOLIN  HFA) 108 (90 Base) MCG/ACT inhaler Inhale 2 puffs into the lungs  every 6 (six) hours as needed for wheezing or shortness of breath. 6.7 g 3   beclomethasone (QVAR  REDIHALER) 40 MCG/ACT inhaler Inhale 2 puffs into the lungs 2 (two) times daily. 1 each 3   No current facility-administered medications on file prior to visit.  [2] No Known Allergies  "

## 2024-05-22 NOTE — Telephone Encounter (Signed)
 FYI Only or Action Required?: FYI only for provider: appointment scheduled on 2/5.  Patient was last seen in primary care on 11/20/2023 by Tanda Bleacher, MD.  Called Nurse Triage reporting Menstrual Problem.  Symptoms began about a month ago.  Interventions attempted: Rest, hydration, or home remedies.  Symptoms are: gradually worsening.  Triage Disposition: See PCP Within 2 Weeks  Patient/caregiver understands and will follow disposition?: Yes  Message from [place] sent at 05/22/2024  1:05 PM EST  Irregular cycle. Full flow cycle started two weeks ago for four days stopped for about 3 days and came back she is now experiencing bleeding again, no cramping. This also happened last month as well.   Reason for Disposition  Periods last > 7 days  Answer Assessment - Initial Assessment Questions The last 2 months patient menstrual cycle has been very irregular.  Typically 4 days long and consistently on time.  Last month- had her cycle- a few days later a lighter bleeding started and lasted for the whole month. No cramping/pain. Regular cycle started with her normal menstrual cramping and blood flow.  Few days after that cycle ended, light bleeding restarted Monday. Today bleeding was heavy with smll amount of tissue passed.   Appt with PCP office today to assess. Patient understands ED precautions   1. BLEEDING SEVERITY: Describe the bleeding that you are having. How much bleeding is there?      Heavy today  2. ONSET: When did the bleeding begin? Is it continuing now?     2 months of abnormal  3. MENSTRUAL PERIOD: When was the last normal menstrual period? How is this different than your period?     2 months  4. REGULARITY: How regular are your periods?     4days  5. ABDOMEN PAIN: Do you have any pain? How bad is the pain?  (e.g., Scale 0-10; none, mild, moderate, or severe)     Denies  6. PREGNANCY: Is there any chance you are pregnant? When was your last  menstrual period?     Denies  7. BREASTFEEDING: Are you breastfeeding?     Denies  8. HORMONE MEDICINES: Are you taking any hormone medicines, prescription or over-the-counter? (e.g., birth control pills, estrogen)     Denies 9. BLOOD THINNER MEDICINES: Do you take any blood thinners? (e.g., Coumadin / warfarin, Pradaxa / dabigatran, aspirin)     Denies  10. CAUSE: What do you think is causing the bleeding? (e.g., recent gyn surgery, recent gyn procedure; known bleeding disorder, cervical cancer, polycystic ovarian disease, fibroids)         Denies  11. HEMODYNAMIC STATUS: Are you weak or feeling lightheaded? If Yes, ask: Can you stand and walk normally?        Dizziness occasionally  12. OTHER SYMPTOMS: What other symptoms are you having with the bleeding? (e.g., passed tissue, vaginal discharge, fever, menstrual-type cramps)       Tiny piece of tissue today  Protocols used: Vaginal Bleeding - Abnormal-A-AH

## 2024-05-22 NOTE — Patient Instructions (Signed)
" °  VISIT SUMMARY: Today, you were seen for abnormal menstrual bleeding that has been irregular over the past two months. You experienced light spotting in January and heavier bleeding in February. A home pregnancy test was negative, and you have not been using birth control recently.  YOUR PLAN: -DYSFUNCTIONAL UTERINE BLEEDING: Dysfunctional uterine bleeding is irregular bleeding from the uterus, often due to hormonal imbalances, fibroids, or polyps. We have ordered a thyroid  hormone level test and performed a urine pregnancy test. You have been prescribed birth control pills for 1-2 months to help regulate the bleeding. Additionally, a pelvic ultrasound has been ordered to check for fibroids or polyps. Please apply for an orange card or cone discount card for a potential gynecology referral. Report if the bleeding does not stop while on birth control pills.  INSTRUCTIONS: Please follow up with the results of the thyroid  hormone level test and pelvic ultrasound. Report any persistent bleeding while on birth control pills.    Contains text generated by [employer].   "

## 2024-05-22 NOTE — Telephone Encounter (Signed)
 Noted.  Patient has appointment scheduled to discuss concerns today.

## 2024-05-23 ENCOUNTER — Ambulatory Visit: Payer: Self-pay | Admitting: Internal Medicine

## 2024-05-23 ENCOUNTER — Other Ambulatory Visit: Payer: Self-pay

## 2024-05-23 LAB — CBC
Hematocrit: 41.8 % (ref 34.0–46.6)
Hemoglobin: 13.3 g/dL (ref 11.1–15.9)
MCH: 28.4 pg (ref 26.6–33.0)
MCHC: 31.8 g/dL (ref 31.5–35.7)
MCV: 89 fL (ref 79–97)
Platelets: 216 10*3/uL (ref 150–450)
RBC: 4.69 x10E6/uL (ref 3.77–5.28)
RDW: 12.4 % (ref 11.7–15.4)
WBC: 6.9 10*3/uL (ref 3.4–10.8)

## 2024-05-23 LAB — TSH: TSH: 3.35 u[IU]/mL (ref 0.450–4.500)

## 2024-05-23 LAB — BETA HCG QUANT (REF LAB): hCG Quant: <1 m[IU]/mL

## 2024-05-27 ENCOUNTER — Ambulatory Visit (HOSPITAL_COMMUNITY): Payer: Self-pay

## 2024-07-22 ENCOUNTER — Ambulatory Visit: Payer: Self-pay | Admitting: Family Medicine
